# Patient Record
Sex: Male | Born: 1985 | Race: White | Hispanic: No | Marital: Single | Smoking: Current every day smoker
Health system: Southern US, Community
[De-identification: ages and names within clinical notes are randomized; demographics above are authoritative.]

## PROBLEM LIST (undated history)

## (undated) DIAGNOSIS — N200 Calculus of kidney: Secondary | ICD-10-CM

## (undated) DIAGNOSIS — I10 Essential (primary) hypertension: Secondary | ICD-10-CM

## (undated) HISTORY — PX: FOOT TUMOR EXCISION: SUR566

---
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
## 2004-01-05 ENCOUNTER — Encounter: Admission: RE | Admit: 2004-01-05 | Discharge: 2004-01-05 | Payer: Self-pay | Admitting: Family Medicine

## 2007-08-30 ENCOUNTER — Emergency Department (HOSPITAL_COMMUNITY): Admission: EM | Admit: 2007-08-30 | Discharge: 2007-08-30 | Payer: Self-pay | Admitting: Family Medicine

## 2009-12-14 ENCOUNTER — Emergency Department (HOSPITAL_COMMUNITY): Admission: EM | Admit: 2009-12-14 | Discharge: 2009-12-14 | Payer: Self-pay | Admitting: Emergency Medicine

## 2010-10-23 LAB — BASIC METABOLIC PANEL
BUN: 9 mg/dL (ref 6–23)
CO2: 28 mEq/L (ref 19–32)
Calcium: 8.7 mg/dL (ref 8.4–10.5)
Chloride: 105 mEq/L (ref 96–112)
Creatinine, Ser: 0.81 mg/dL (ref 0.4–1.5)
GFR calc Af Amer: >60 mL/min (ref >60)
GFR calc non Af Amer: >60 mL/min (ref >60)
Glucose, Bld: 115 mg/dL — ABNORMAL HIGH (ref 70–99)
Potassium: 4.2 mEq/L (ref 3.5–5.1)
Sodium: 140 mEq/L (ref 135–145)

## 2010-10-23 LAB — URINALYSIS, ROUTINE W REFLEX MICROSCOPIC
Glucose, UA: NEGATIVE mg/dL
Ketones, ur: NEGATIVE mg/dL
Leukocytes, UA: NEGATIVE
Nitrite: NEGATIVE
Specific Gravity, Urine: 1.006 (ref 1.005–1.030)
pH: 6.5 (ref 5.0–8.0)

## 2010-10-23 LAB — HEPATIC FUNCTION PANEL
ALT: 32 U/L (ref 0–53)
Alkaline Phosphatase: 82 U/L (ref 39–117)
Total Bilirubin: 0.5 mg/dL (ref 0.3–1.2)

## 2010-10-23 LAB — CBC
HCT: 47.9 % (ref 39.0–52.0)
Hemoglobin: 15.9 g/dL (ref 13.0–17.0)
RDW: 13 % (ref 11.5–15.5)
WBC: 16.8 10*3/uL — ABNORMAL HIGH (ref 4.0–10.5)

## 2010-10-23 LAB — URINE MICROSCOPIC-ADD ON

## 2010-10-23 LAB — LIPASE, BLOOD: Lipase: 21 U/L (ref 11–59)

## 2010-10-23 LAB — DIFFERENTIAL
Eosinophils Relative: 1 % (ref 0–5)
Lymphocytes Relative: 7 % — ABNORMAL LOW (ref 12–46)
Lymphs Abs: 1.1 10*3/uL (ref 0.7–4.0)
Monocytes Absolute: 0.5 10*3/uL (ref 0.1–1.0)

## 2011-09-15 ENCOUNTER — Encounter (HOSPITAL_BASED_OUTPATIENT_CLINIC_OR_DEPARTMENT_OTHER): Payer: Self-pay | Admitting: *Deleted

## 2011-09-15 ENCOUNTER — Emergency Department (INDEPENDENT_AMBULATORY_CARE_PROVIDER_SITE_OTHER): Payer: Managed Care, Other (non HMO)

## 2011-09-15 ENCOUNTER — Emergency Department (HOSPITAL_BASED_OUTPATIENT_CLINIC_OR_DEPARTMENT_OTHER)
Admission: EM | Admit: 2011-09-15 | Discharge: 2011-09-15 | Disposition: A | Discharge status: Home / Self Care | Payer: Managed Care, Other (non HMO) | Attending: Emergency Medicine | Admitting: Emergency Medicine

## 2011-09-15 DIAGNOSIS — M79609 Pain in unspecified limb: Secondary | ICD-10-CM

## 2011-09-15 DIAGNOSIS — M65979 Unspecified synovitis and tenosynovitis, unspecified ankle and foot: Secondary | ICD-10-CM | POA: Insufficient documentation

## 2011-09-15 DIAGNOSIS — M25569 Pain in unspecified knee: Secondary | ICD-10-CM

## 2011-09-15 DIAGNOSIS — F17200 Nicotine dependence, unspecified, uncomplicated: Secondary | ICD-10-CM | POA: Insufficient documentation

## 2011-09-15 DIAGNOSIS — M25579 Pain in unspecified ankle and joints of unspecified foot: Secondary | ICD-10-CM | POA: Insufficient documentation

## 2011-09-15 DIAGNOSIS — M25673 Stiffness of unspecified ankle, not elsewhere classified: Secondary | ICD-10-CM

## 2011-09-15 DIAGNOSIS — M25669 Stiffness of unspecified knee, not elsewhere classified: Secondary | ICD-10-CM

## 2011-09-15 DIAGNOSIS — M659 Synovitis and tenosynovitis, unspecified: Secondary | ICD-10-CM | POA: Insufficient documentation

## 2011-09-15 DIAGNOSIS — M25676 Stiffness of unspecified foot, not elsewhere classified: Secondary | ICD-10-CM

## 2011-09-15 DIAGNOSIS — M7750 Other enthesopathy of unspecified foot: Secondary | ICD-10-CM

## 2011-09-15 MED ORDER — IBUPROFEN 800 MG PO TABS: 800.0000 mg | ORAL_TABLET | Freq: Three times a day (TID) | ORAL | Status: DC

## 2011-09-15 MED ORDER — IBUPROFEN 800 MG PO TABS: 800.0000 mg | ORAL_TABLET | Freq: Three times a day (TID) | ORAL | Status: AC

## 2011-09-15 MED ORDER — HYDROCODONE-ACETAMINOPHEN 5-325 MG PO TABS: 2.0000 | ORAL_TABLET | ORAL | Status: AC | PRN

## 2011-09-15 NOTE — ED Provider Notes (Signed)
History     CSN: [id number]  Arrival date & time 09/15/11  1940   First MD Initiated Contact with Patient 09/15/11 2125      Chief Complaint  Patient presents with  . Knee Pain  . Foot Pain    (Consider location/radiation/quality/duration/timing/severity/associated sxs/prior treatment) Patient is a 26 y.o. male presenting with foot injury. The history is provided by the patient. No language interpreter was used.  Foot Injury  The incident occurred more than 1 week ago. The incident occurred at work. There was no injury mechanism. The pain is present in the right foot, left foot and left knee. The quality of the pain is described as aching and burning. The pain is at a severity of 7/10. The pain is severe. The pain has been constant since onset. Associated symptoms include inability to bear weight and tingling. He reports no foreign bodies present. He has tried nothing for the symptoms. The treatment provided no relief.  Pt reports he had pain in the top of both feet.  No know injury.  Pt reports he can not walk pain is so severe.  Pt reports he also has pain in his left knee.  Pt reports co worker had to carry him on Friday the pain was so bad.  History reviewed. No pertinent past medical history.  Past Surgical History  Procedure Date  . Foot tumor excision     History reviewed. No pertinent family history.  History  Substance Use Topics  . Smoking status: Current Everyday Smoker  . Smokeless tobacco: Not on file  . Alcohol Use: No      Review of Systems  Musculoskeletal: Positive for myalgias and gait problem. Negative for back pain and joint swelling.  Neurological: Positive for tingling.  All other systems reviewed and are negative.    Allergies  Review of patient's allergies indicates no known allergies.  Home Medications  No current outpatient prescriptions on file.  BP 126/78  Pulse 75  Temp(Src) 98.2 F (36.8 C) (Oral)  Resp 16  SpO2 100%  Physical  Exam  Nursing note and vitals reviewed. Constitutional: He is oriented to person, place, and time. He appears well-developed and well-nourished.  HENT:  Head: Normocephalic.  Musculoskeletal: He exhibits edema and tenderness.  Neurological: He is alert and oriented to person, place, and time. He has normal reflexes.  Skin: Skin is warm.  Psychiatric: He has a normal mood and affect.    ED Course  Procedures (including critical care time)  Labs Reviewed - No data to display Dg Knee 1-2 Views Left  09/15/2011  *RADIOLOGY REPORT*  Clinical Data: Pain and stiffness in the knee for several weeks. No known injury.  LEFT KNEE - 1-2 VIEW  Comparison: None.  Findings: The left knee appears intact.  No evidence of acute fracture or subluxation.  No focal bone lesion or bone destruction. Focal broad-based depression in the articular surface of the patella may represent changes of chondromalacia.  Bone cortex and trabecular architecture are otherwise intact.  No abnormal periosteal reaction.  No radiopaque foreign bodies.  No significant effusion.  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  No evidence of acute fracture or subluxation.  Original Report Authenticated By: Marlon Pel, M.D.   Dg Foot 2 Views Left  09/15/2011  *RADIOLOGY REPORT*  Clinical Data: Pain and stiffness in the foot for several weeks. No known injury.  LEFT FOOT - 2 VIEW  Comparison: None.  Findings: The left foot appears intact.  No evidence of acute fracture or subluxation.  No focal bone lesion or bone destruction. Bone cortex and trabecular architecture appear intact.  No abnormal periosteal reaction.  No radiopaque foreign bodies.  IMPRESSION: No acute bony abnormalities identified.  Original Report Authenticated By: Marlon Pel, M.D.   Dg Foot 2 Views Right  09/15/2011  *RADIOLOGY REPORT*  Clinical Data: Pain and stiffness in the foot for several  weeks. No known injury.  RIGHT FOOT - 2 VIEW  Comparison: None.  Findings: The right foot appears intact.  No evidence of acute fracture or subluxation.  No focal bone lesion or bone destruction. Bone cortex and trabecular architecture appear intact.  No abnormal periosteal reaction.  No radiopaque foreign bodies.  IMPRESSION: No acute bony abnormalities identified.  Original Report Authenticated By: Marlon Pel, M.D.     No diagnosis found.    MDM   I advised pt to follow up with Dr. Pearletha Forge for recheck.  Pt given rx for pain and ibuprofen for inflammation.         Langston Masker, [place] 09/15/11 2305

## 2011-09-15 NOTE — ED Provider Notes (Signed)
Medical screening examination/treatment/procedure(s) were performed by non-physician practitioner and as supervising physician I was immediately available for consultation/collaboration.   Forbes Cellar, MD 09/15/11 [phone]

## 2011-09-15 NOTE — ED Notes (Signed)
Pt presents with bilat foot pain and left knee pain x a couple of weeks

## 2011-10-03 ENCOUNTER — Ambulatory Visit (INDEPENDENT_AMBULATORY_CARE_PROVIDER_SITE_OTHER): Payer: Managed Care, Other (non HMO) | Admitting: Family Medicine

## 2011-10-03 ENCOUNTER — Ambulatory Visit: Payer: Managed Care, Other (non HMO)

## 2011-10-03 VITALS — BP 135/83 | HR 60 | Temp 98.7°F | Resp 12 | Ht 73.0 in | Wt 230.0 lb

## 2011-10-03 DIAGNOSIS — S0180XA Unspecified open wound of other part of head, initial encounter: Secondary | ICD-10-CM

## 2011-10-03 DIAGNOSIS — T148XXA Other injury of unspecified body region, initial encounter: Secondary | ICD-10-CM

## 2011-10-03 DIAGNOSIS — S0181XA Laceration without foreign body of other part of head, initial encounter: Secondary | ICD-10-CM

## 2011-10-03 DIAGNOSIS — M549 Dorsalgia, unspecified: Secondary | ICD-10-CM

## 2011-10-03 DIAGNOSIS — G501 Atypical facial pain: Secondary | ICD-10-CM

## 2011-10-03 MED ORDER — CYCLOBENZAPRINE HCL 5 MG PO TABS: 5.0000 mg | ORAL_TABLET | Freq: Three times a day (TID) | ORAL | Status: AC | PRN

## 2011-10-03 MED ORDER — SULFAMETHOXAZOLE-TRIMETHOPRIM 800-160 MG PO TABS: 1.0000 | ORAL_TABLET | Freq: Two times a day (BID) | ORAL | Status: AC

## 2011-10-03 MED ORDER — TRAMADOL HCL 50 MG PO TABS: 50.0000 mg | ORAL_TABLET | Freq: Three times a day (TID) | ORAL | Status: AC | PRN

## 2011-10-03 NOTE — Progress Notes (Signed)
  [facility]:  Office Visit  Chief Complaint:  Chief Complaint  Patient presents with  . Facial Laceration    HPI: Ross Best is a 26 y.o. male who complains of  Nasal laceration after getting hit by a crow bar  In the face specifically across the bridge of his nose and left eye socket while trying to dig up a tree root/clearing out underbrush.Tetanus UTD, 2 years ago. MIld pain, no fevers, chills. Pain is behind eyes. Denies bleeding. He also pulled his back.   No past medical history on file. Past Surgical History  Procedure Date  . Foot tumor excision    History   Social History  . Marital Status: Single    Spouse Name: N/A    Number of Children: N/A  . Years of Education: N/A   Social History Main Topics  . Smoking status: Current Everyday Smoker -- 1.0 packs/day    Types: Cigarettes  . Smokeless tobacco: None  . Alcohol Use: No  . Drug Use: No  . Sexually Active: None   Other Topics Concern  . None   Social History Narrative  . None   No family history on file. No Known Allergies Prior to Admission medications   Not on File     ROS: The patient denies fevers, chills, night sweats, unintentional weight loss, chest pain, palpitations, wheezing, dyspnea on exertion, nausea, vomiting, abdominal pain, dysuria, hematuria, melena, numbness, weakness, or tingling. + facial pain and back pain  All other systems have been reviewed and were otherwise negative with the exception of those mentioned in the HPI and as above.    PHYSICAL EXAM: Filed Vitals:   10/03/11 1846  BP: 135/83  Pulse: 60  Temp: 98.7 F (37.1 C)  Resp: 12   Filed Vitals:   10/03/11 1846  Height: 6\' 1"  (1.854 m)  Weight: 230 lb (104.327 kg)    Body mass index is 30.34 kg/(m^2).  General: Alert, no acute distress HEENT:  Normocephalic, atraumatic, oropharynx patent. + laceration, pustular across nasal bridge, no appreciable ecchymosis or battle signs. Mild  tenderness along nasal bridge and left cheekbone Cardiovascular:  Regular rate and rhythm, no rubs murmurs or gallops.  No Carotid bruits, radial pulse intact. No pedal edema.  Respiratory: Clear to auscultation bilaterally.  No wheezes, rales, or rhonchi.  No cyanosis, no use of accessory musculature GI: No organomegaly, abdomen is soft and non-tender, positive bowel sounds.  No masses. Skin: No rashes. + wound Neurologic: Facial musculature symmetric. CN2-CN12 grossly intact.  Psychiatric: Patient is appropriate throughout our interaction. Lymphatic: No cervical lymphadenopathy Musculoskeletal: Gait intact. Back normal AROM, PROM, tender along bilateral paraspinal msk, 5/5 motor and sensation   LABS:  EKG/XRAY:   Primary read interpreted by Dr. Conley Rolls at [facility]. Xrays of orbits and nasal bones are negative for fx or dislocation.   ASSESSMENT/PLAN: Encounter Diagnoses  Name Primary?  . Face lacerations Yes  . Back pain    Rx Bactrim for wound infection, wound cx obtained, wound care instructions given, wound cx results pending Rx Tramadol, Flexeril for back pain, most likely muscular in origin, if no improvement in 2 weeks then return for xray   Hamilton Capri PHUONG, DO 10/03/2011 8:31 PM

## 2011-10-06 LAB — WOUND CULTURE
Gram Stain: NONE SEEN
Gram Stain: NONE SEEN
Organism ID, Bacteria: NORMAL

## 2012-04-07 ENCOUNTER — Emergency Department (HOSPITAL_COMMUNITY): Payer: Self-pay

## 2012-04-07 ENCOUNTER — Emergency Department (HOSPITAL_COMMUNITY)
Admission: EM | Admit: 2012-04-07 | Discharge: 2012-04-07 | Discharge status: Against Medical Advice | Payer: Self-pay | Attending: Emergency Medicine | Admitting: Emergency Medicine

## 2012-04-07 DIAGNOSIS — R002 Palpitations: Secondary | ICD-10-CM | POA: Insufficient documentation

## 2012-04-07 LAB — POCT I-STAT, CHEM 8
BUN: 10 mg/dL (ref 6–23)
Calcium, Ion: 1.2 mmol/L (ref 1.12–1.23)
Chloride: 104 mEq/L (ref 96–112)
Glucose, Bld: 84 mg/dL (ref 70–99)
TCO2: 26 mmol/L (ref 0–100)

## 2012-04-07 LAB — CBC WITH DIFFERENTIAL/PLATELET
Basophils Relative: 0 % (ref 0–1)
Eosinophils Relative: 2 % (ref 0–5)
HCT: 45.8 % (ref 39.0–52.0)
Hemoglobin: 15.6 g/dL (ref 13.0–17.0)
Lymphocytes Relative: 21 % (ref 12–46)
MCHC: 34.1 g/dL (ref 30.0–36.0)
MCV: 84.5 fL (ref 78.0–100.0)
Monocytes Absolute: 0.9 10*3/uL (ref 0.1–1.0)
Monocytes Relative: 8 % (ref 3–12)
Neutro Abs: 7.2 10*3/uL (ref 1.7–7.7)

## 2012-04-07 NOTE — ED Notes (Signed)
Pt reports palpitations x 2 days. ekg normal. Denies chest pain. 324asa pta. 18g(L)AC.

## 2013-03-03 IMAGING — CR DG FOOT 2V*R*
2 series · 2 of 2 positions shown · non-contrast
Comparison: None.

CLINICAL DATA: Pain and stiffness in the foot for several weeks.
No known injury.

RIGHT FOOT - 2 VIEW

[t foot ap right]
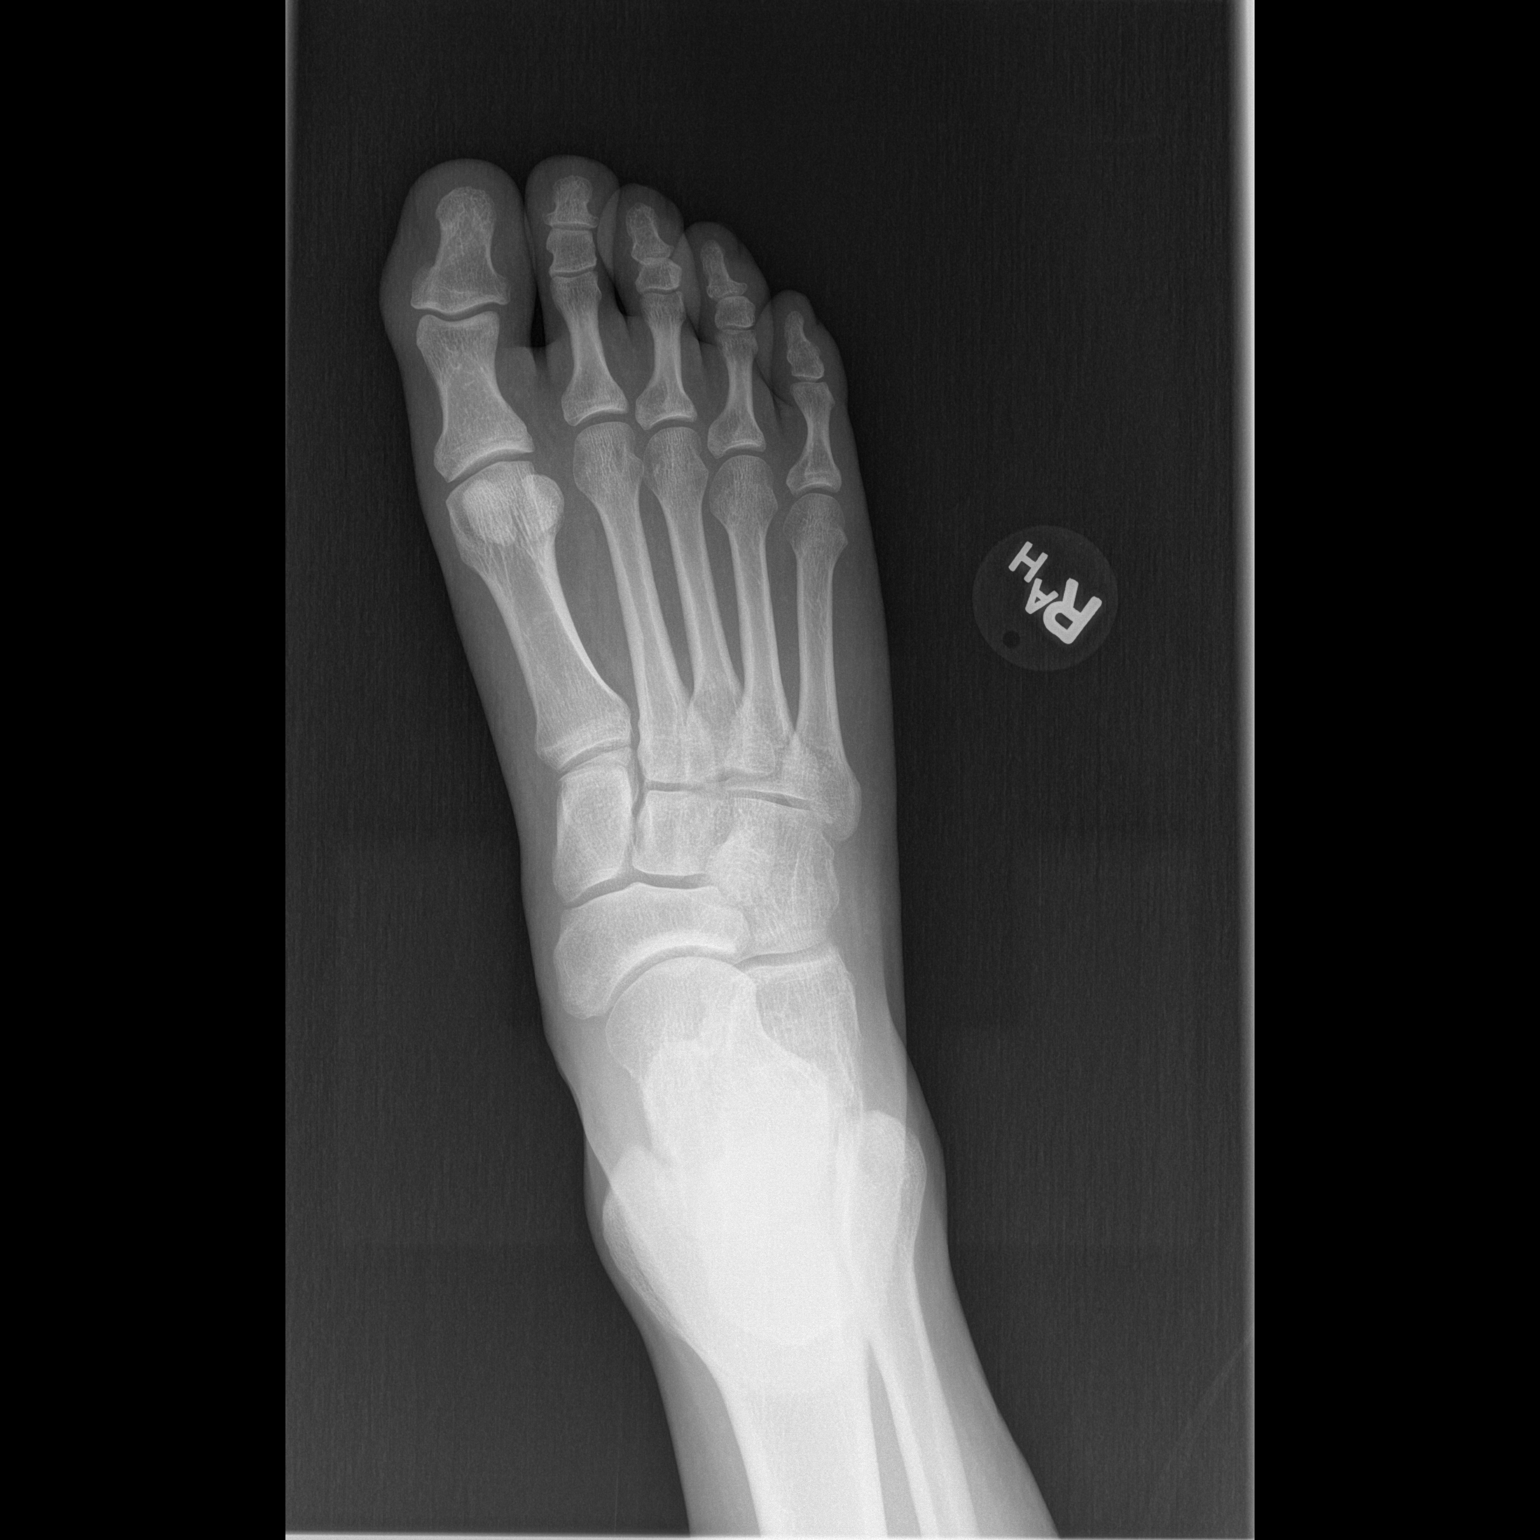

[t foot lat right]
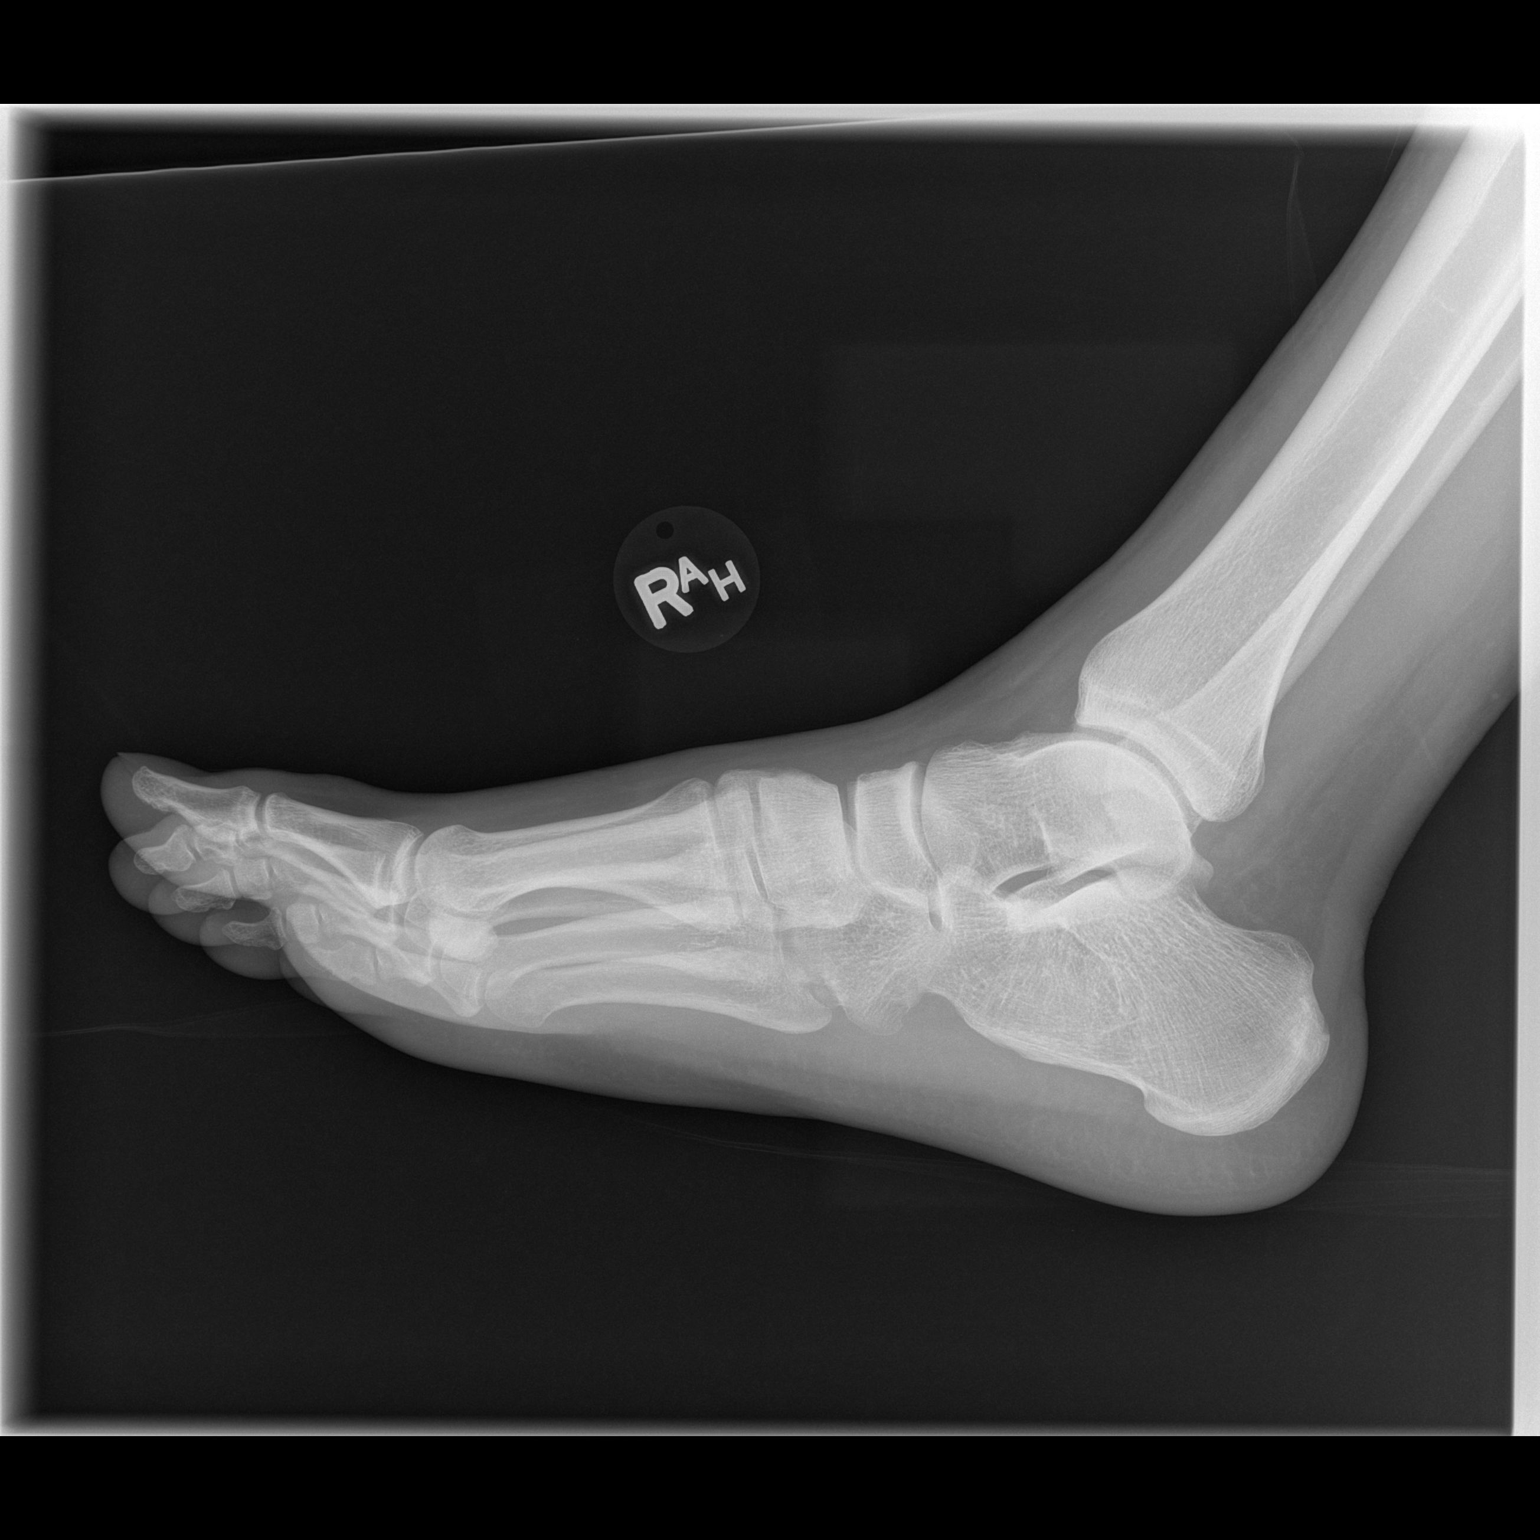

[2 of 2 positions shown; findings below may reference images not displayed]

FINDINGS: The right foot appears intact.  No evidence of acute
fracture or subluxation.  No focal bone lesion or bone destruction.
Bone cortex and trabecular architecture appear intact.  No abnormal
periosteal reaction.  No radiopaque foreign bodies.
IMPRESSION: No acute bony abnormalities identified.

## 2013-03-03 IMAGING — CR DG KNEE 1-2V*L*
2 series · 2 of 2 positions shown · non-contrast
Comparison: None.

CLINICAL DATA: Pain and stiffness in the knee for several weeks.
No known injury.

LEFT KNEE - 1-2 VIEW

[t knee ap left]
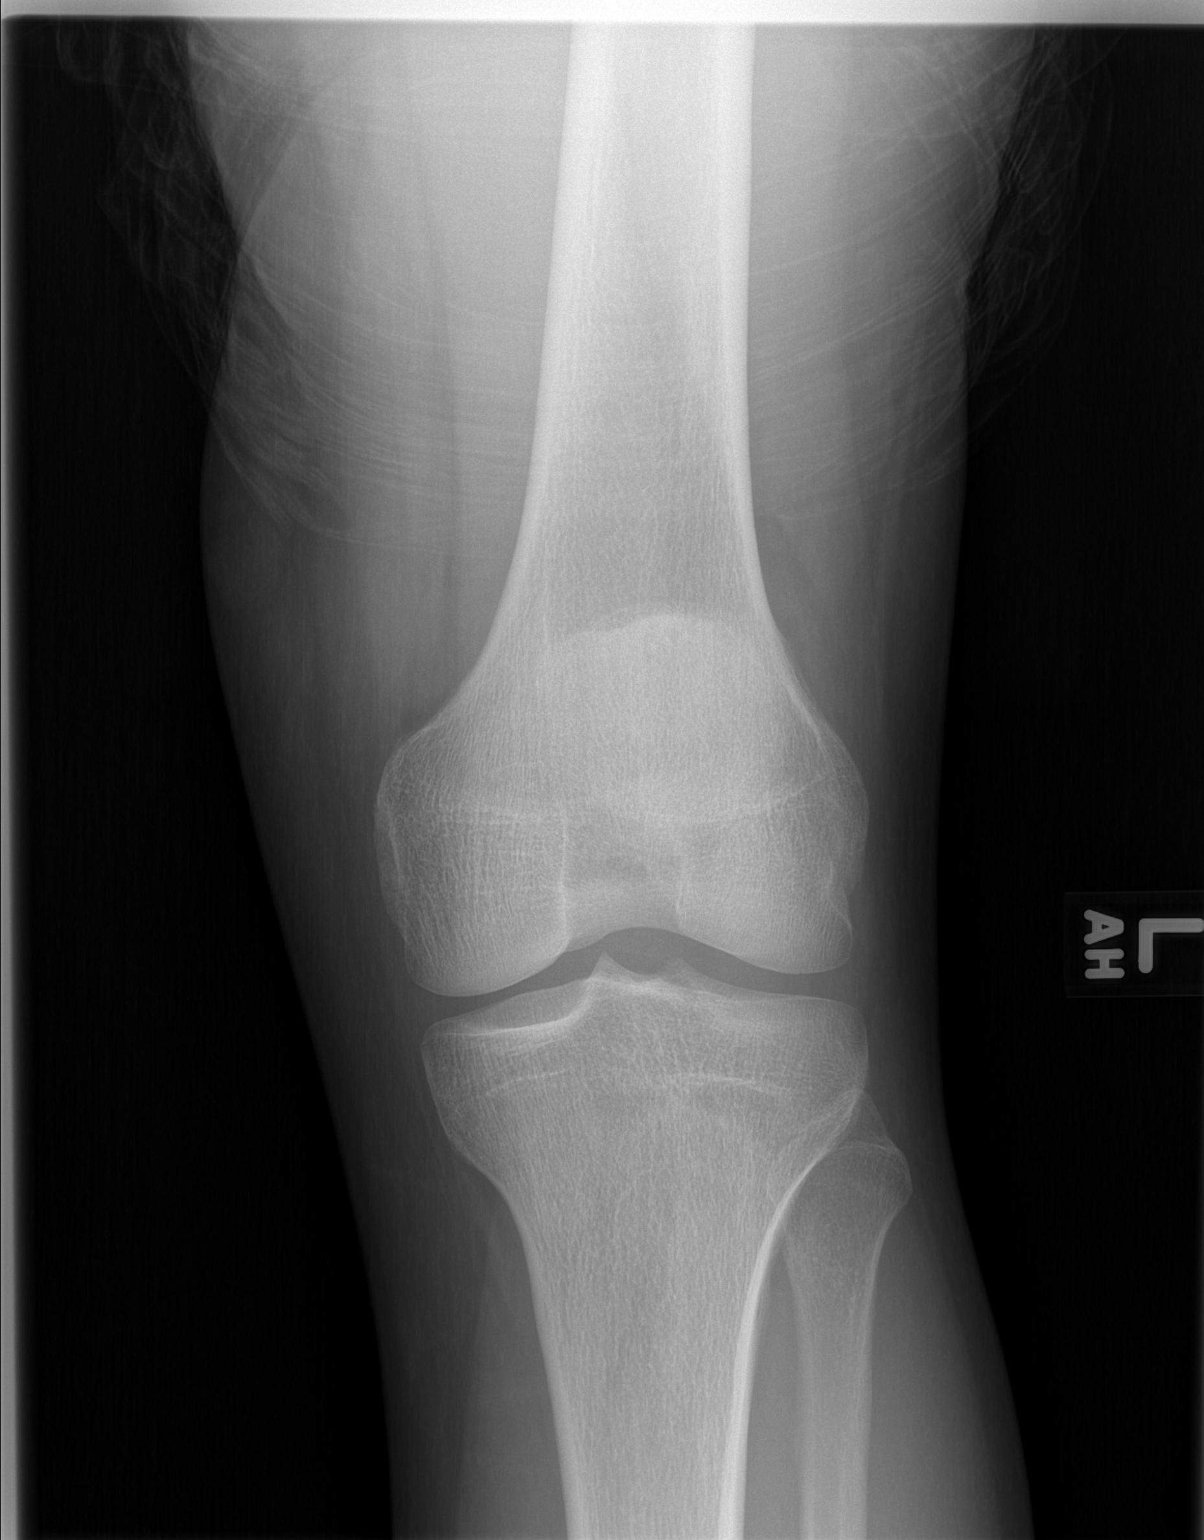

[t knee lat left]
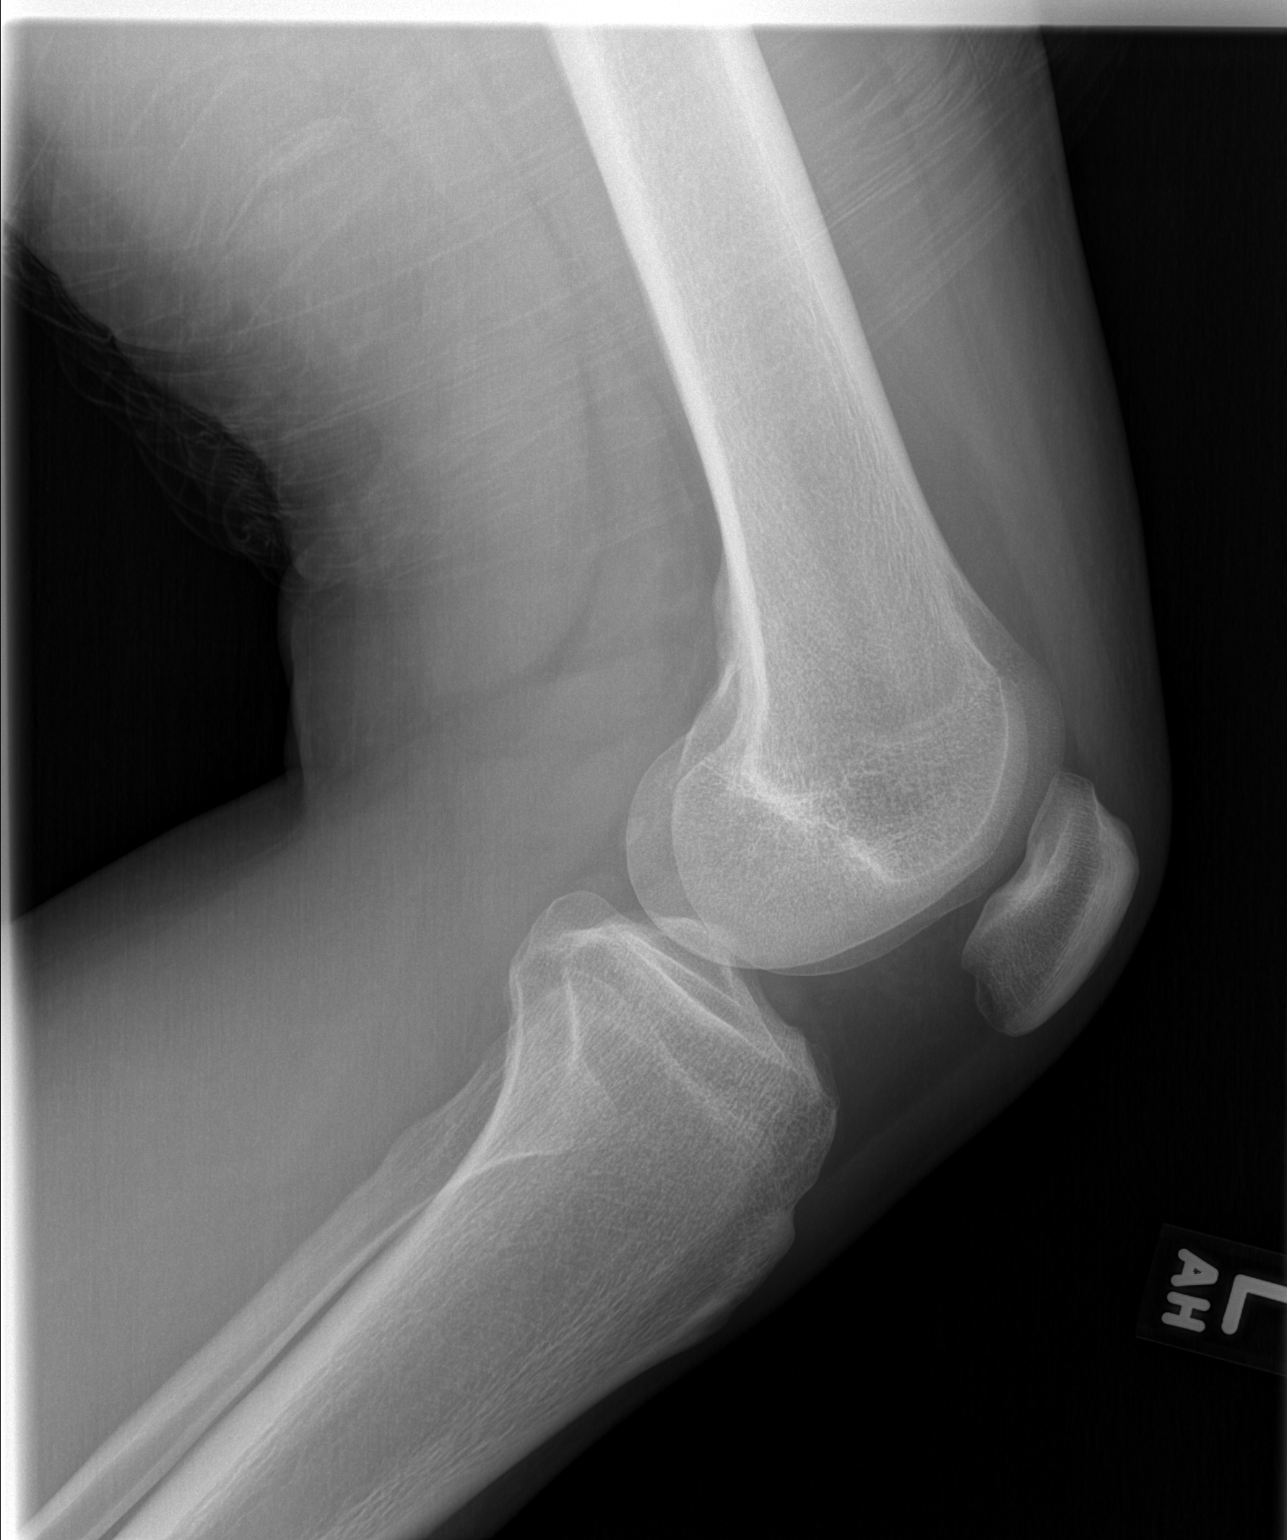

[2 of 2 positions shown; findings below may reference images not displayed]

FINDINGS: The left knee appears intact.  No evidence of acute
fracture or subluxation.  No focal bone lesion or bone destruction.
Focal broad-based depression in the articular surface of the
patella may represent changes of chondromalacia.  Bone cortex and
trabecular architecture are otherwise intact.  No abnormal
periosteal reaction.  No radiopaque foreign bodies.  No significant
effusion.
IMPRESSION: Focal central depression in the articular surface of the patella
may represent chondromalacia.  No evidence of acute fracture or
subluxation.

## 2013-03-21 IMAGING — CR DG NASAL BONES 3+V
2 series · 2 of 2 positions shown · non-contrast
Comparison: None.

CLINICAL DATA: Blunt trauma to the nose.

NASAL BONES - 3+ VIEW

[ap waters]
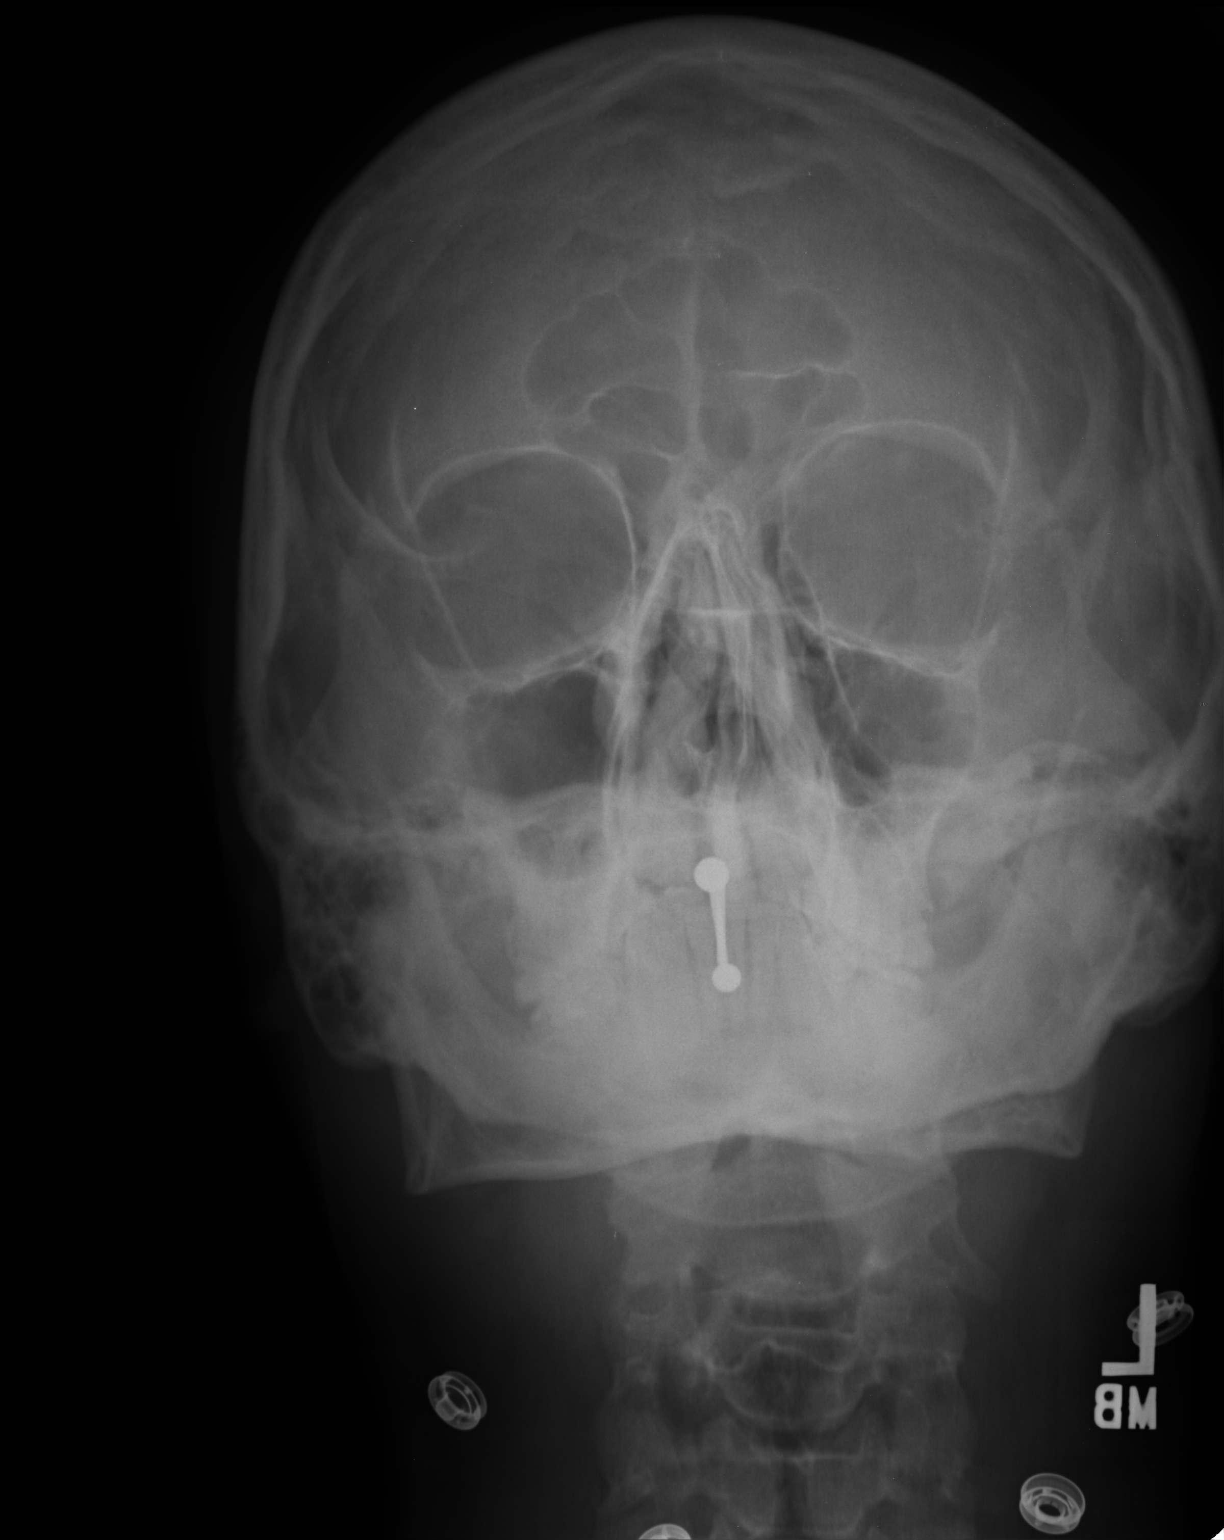

[lateral]
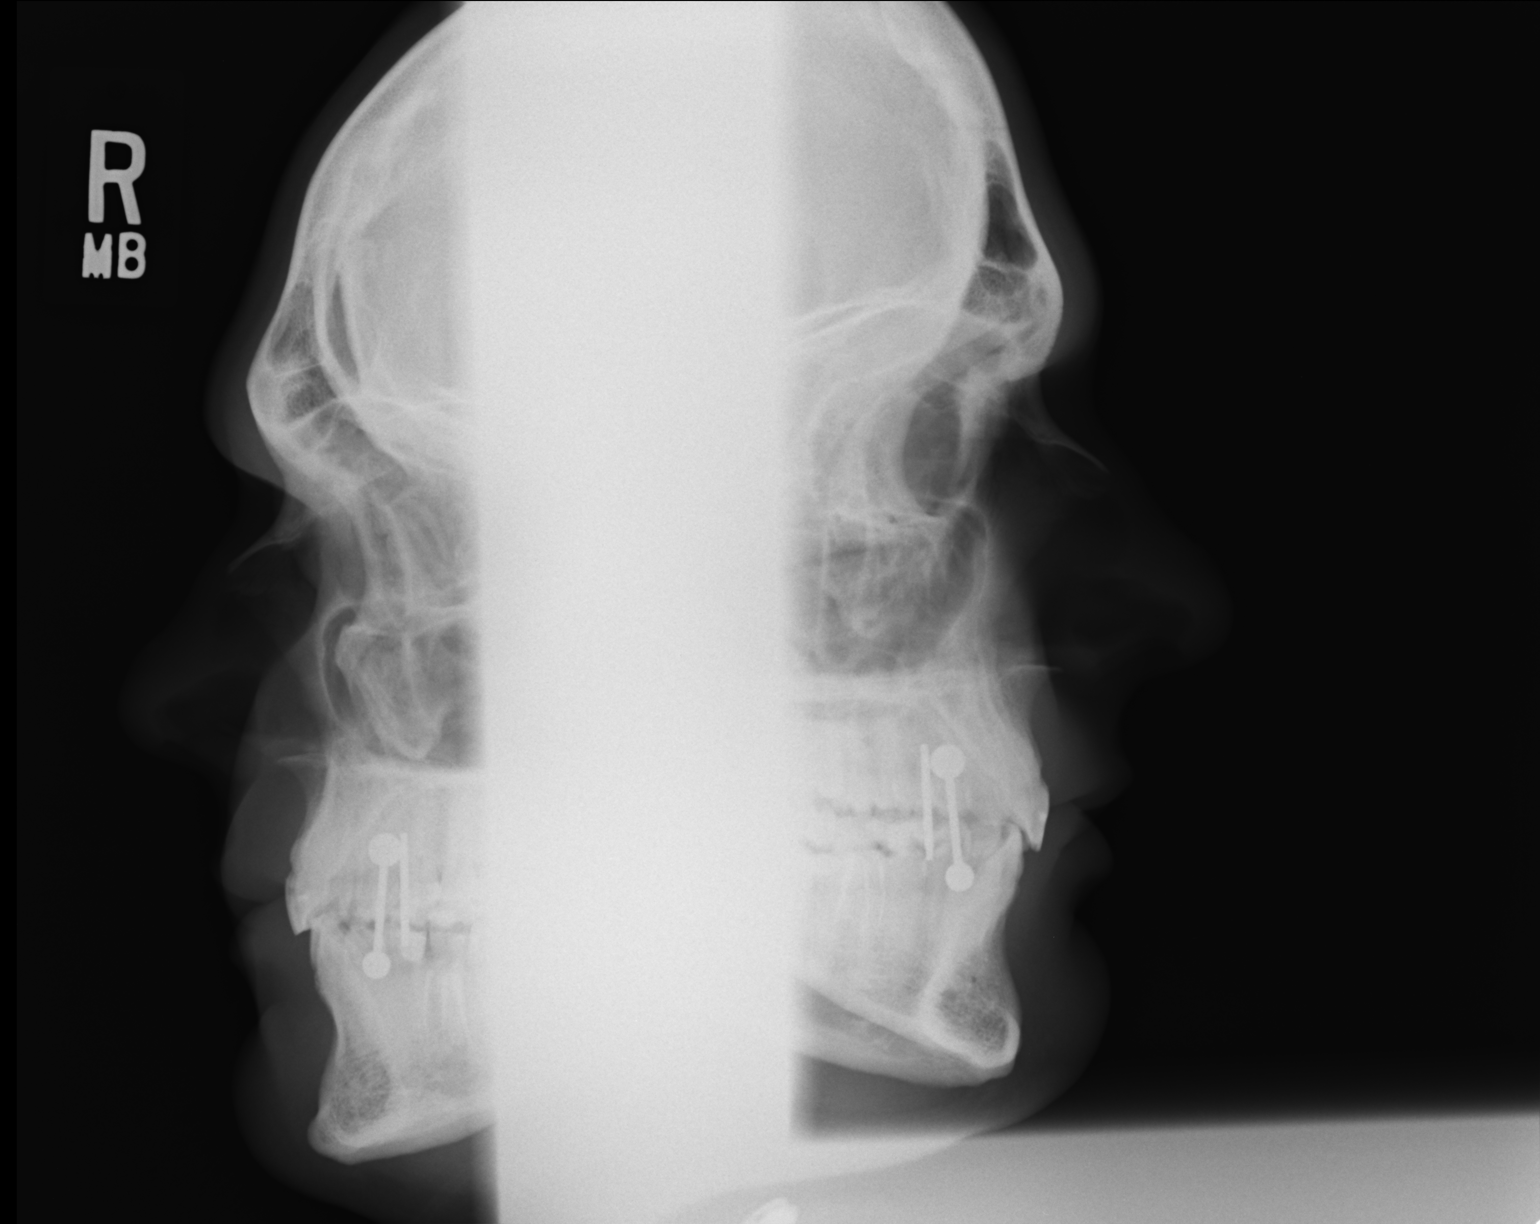

[2 of 2 positions shown; findings below may reference images not displayed]

FINDINGS: There is no acute fracture of the nasal bones.  The
patient does have nasal septal deviation from right to left with a
slight deformity of the left side of the nasal bone but this is
probably either developmental or due to remote trauma.
IMPRESSION: No acute abnormalities of the nasal bones.  The other visualized
facial bones are normal.

## 2013-04-05 ENCOUNTER — Encounter (HOSPITAL_BASED_OUTPATIENT_CLINIC_OR_DEPARTMENT_OTHER): Payer: Self-pay

## 2013-04-05 ENCOUNTER — Emergency Department (HOSPITAL_BASED_OUTPATIENT_CLINIC_OR_DEPARTMENT_OTHER)
Admission: EM | Admit: 2013-04-05 | Discharge: 2013-04-05 | Disposition: A | Discharge status: Home / Self Care | Payer: Managed Care, Other (non HMO) | Attending: Emergency Medicine | Admitting: Emergency Medicine

## 2013-04-05 ENCOUNTER — Emergency Department (HOSPITAL_BASED_OUTPATIENT_CLINIC_OR_DEPARTMENT_OTHER): Payer: Self-pay

## 2013-04-05 DIAGNOSIS — R059 Cough, unspecified: Secondary | ICD-10-CM | POA: Insufficient documentation

## 2013-04-05 DIAGNOSIS — R05 Cough: Secondary | ICD-10-CM

## 2013-04-05 DIAGNOSIS — R509 Fever, unspecified: Secondary | ICD-10-CM | POA: Insufficient documentation

## 2013-04-05 DIAGNOSIS — F17200 Nicotine dependence, unspecified, uncomplicated: Secondary | ICD-10-CM | POA: Insufficient documentation

## 2013-04-05 MED ORDER — HYDROCOD POLST-CHLORPHEN POLST 10-8 MG/5ML PO LQCR: 5.0000 mL | Freq: Two times a day (BID) | ORAL | Status: DC | PRN

## 2013-04-05 NOTE — ED Provider Notes (Signed)
Medical screening examination/treatment/procedure(s) were performed by non-physician practitioner and as supervising physician I was immediately available for consultation/collaboration.   Shanna Cisco, MD 04/05/13 [phone]

## 2013-04-05 NOTE — ED Notes (Signed)
Pt reports a cough and fever unrelieved by OTC medications x 3 weeks.

## 2013-04-05 NOTE — ED Provider Notes (Signed)
CSN: [id number]     Arrival date & time 04/05/13  1115 History   First MD Initiated Contact with Patient 04/05/13 1153     Chief Complaint  Patient presents with  . Cough  . Fever   (Consider location/radiation/quality/duration/timing/severity/associated sxs/prior Treatment) HPI Comments: Pt state that he has had a cough for 3 weeks:with subjective fever:unrelieved with otc medication  Patient is a 27 y.o. male presenting with cough. The history is provided by the patient. No language interpreter was used.  Cough Cough characteristics:  Non-productive Severity:  Moderate Onset quality:  Gradual Duration:  3 weeks Timing:  Constant Progression:  Unchanged Relieved by:  Nothing Exacerbated by: otc medication. Associated symptoms: fever     History reviewed. No pertinent past medical history. History reviewed. No pertinent past surgical history. No family history on file. History  Substance Use Topics  . Smoking status: Current Every Day Smoker -- 1.00 packs/day    Types: Cigarettes  . Smokeless tobacco: Not on file  . Alcohol Use: Yes     Comment: weekly    Review of Systems  Constitutional: Positive for fever.  Respiratory: Positive for cough.   Cardiovascular: Negative.     Allergies  Review of patient's allergies indicates no known allergies.  Home Medications   Current Outpatient Rx  Name  Route  Sig  Dispense  Refill  . chlorpheniramine-HYDROcodone (TUSSIONEX PENNKINETIC ER) 10-8 MG/5ML LQCR   Oral   Take 5 mLs by mouth every 12 (twelve) hours as needed.   140 mL   0    BP 139/82  Pulse 82  Temp(Src) 98.7 F (37.1 C) (Oral)  Resp 18  Ht 6\' 1"  (1.854 m)  Wt 240 lb (108.863 kg)  BMI 31.67 kg/m2  SpO2 99% Physical Exam  Nursing note and vitals reviewed. Constitutional: He appears well-developed and well-nourished.  HENT:  Head: Normocephalic and atraumatic.  Right Ear: External ear normal.  Left Ear: External ear normal.  Mouth/Throat: Oropharynx  is clear and moist.  Eyes: Conjunctivae and EOM are normal.  Neck: Normal range of motion. Neck supple.  Cardiovascular: Normal rate and regular rhythm.   Pulmonary/Chest: Effort normal and breath sounds normal.  Musculoskeletal: Normal range of motion.    ED Course  Procedures (including critical care time) Labs Review Labs Reviewed - No data to display Imaging Review Dg Chest 2 View  04/05/2013   *RADIOLOGY REPORT*  Clinical Data: Cough.  Persistent cough.  Fever.  Cigarette smoker.  CHEST - 2 VIEW  Comparison: None.  Findings:  Cardiopericardial silhouette within normal limits. Mediastinal contours normal. Trachea midline.  No airspace disease or effusion.  IMPRESSION: No active cardiopulmonary disease.   Original Report Authenticated By: Andreas Newport, M.D.    MDM   1. Cough    No infection noted on x-ray:will treat symptomatically    Teressa Lower, NP 04/05/13 1233

## 2013-04-05 NOTE — ED Notes (Signed)
Pt sts shob worsening with exertion.

## 2013-09-24 IMAGING — CR DG CHEST 2V
2 series · 2 of 2 positions shown · non-contrast
Comparison: Chest radiograph 01/05/2004

CLINICAL DATA: Chest pain

CHEST - 2 VIEW

[w chest pa]
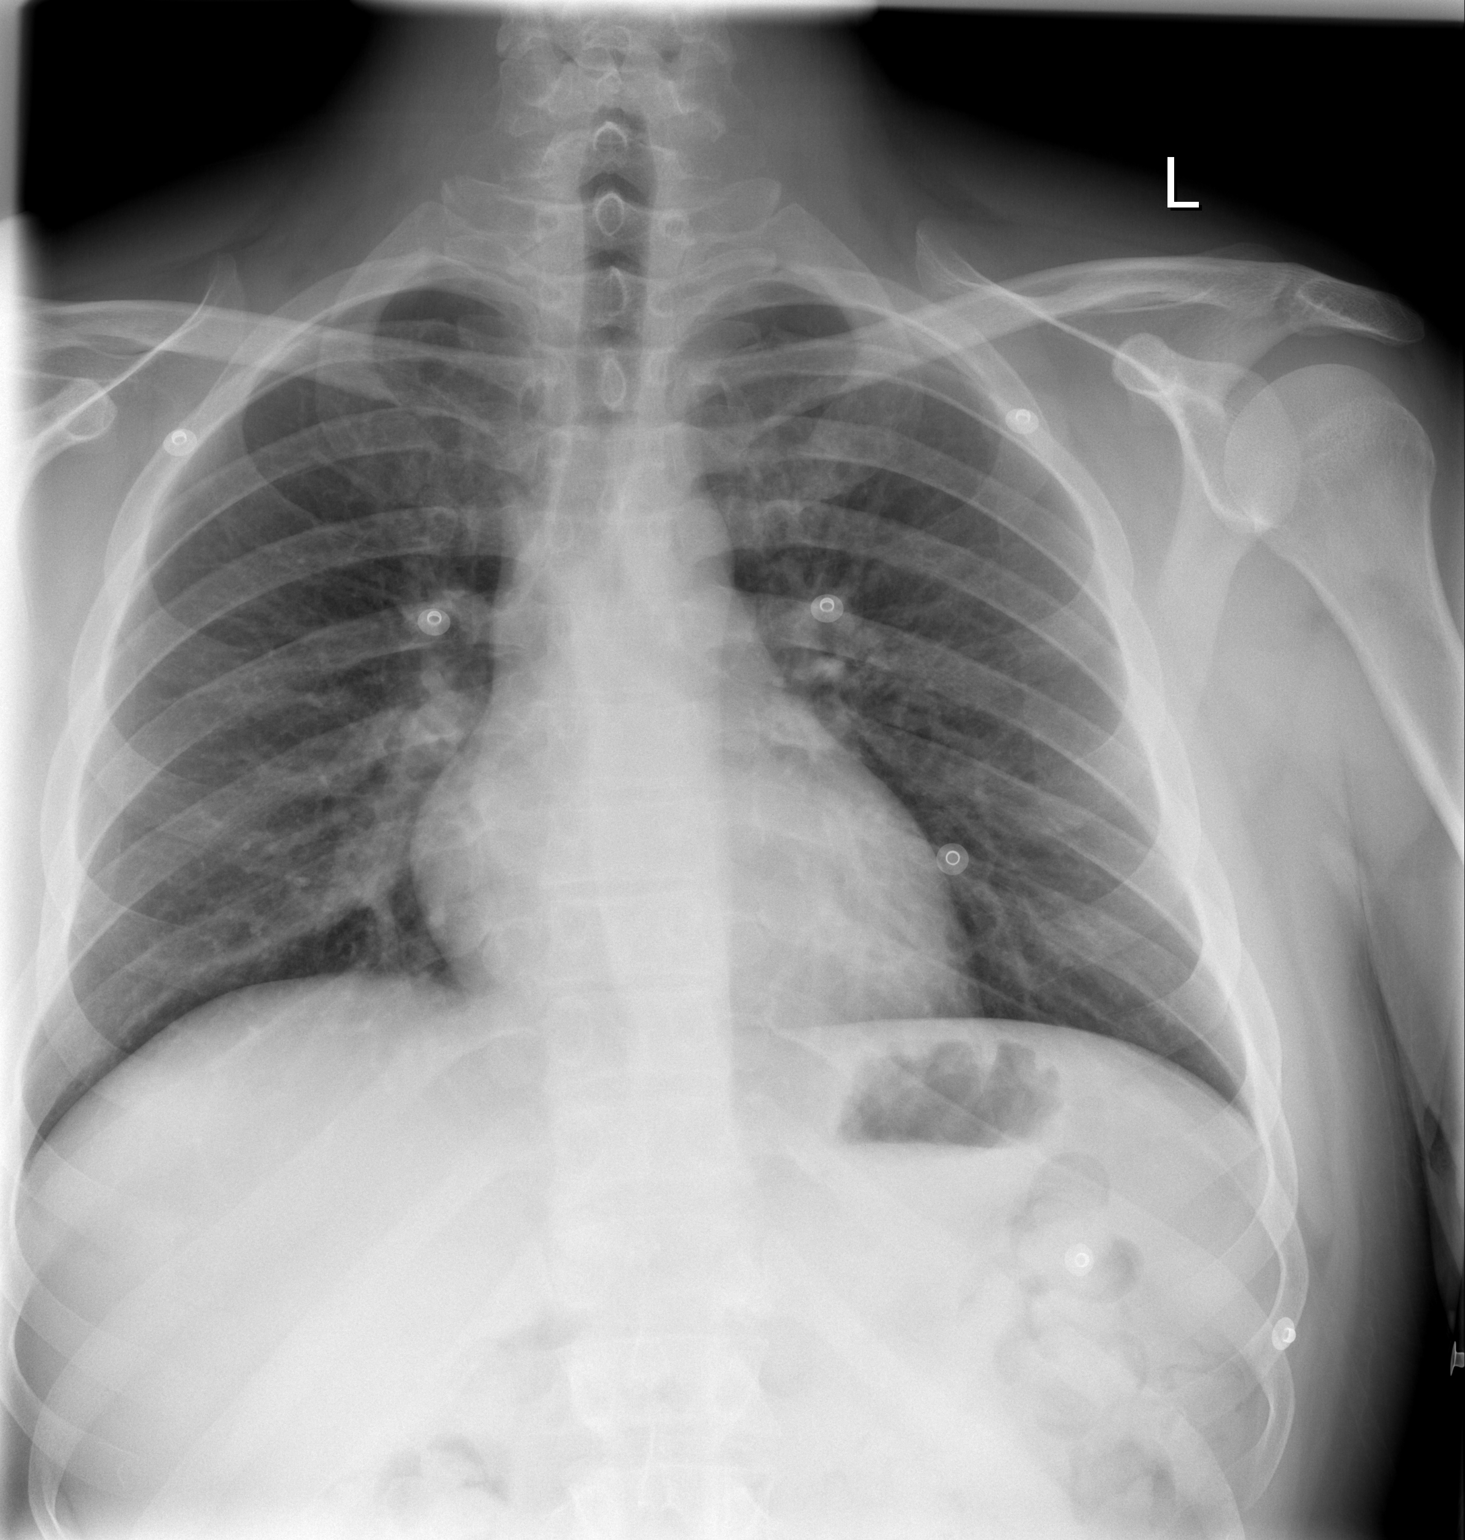

[w chest lat]
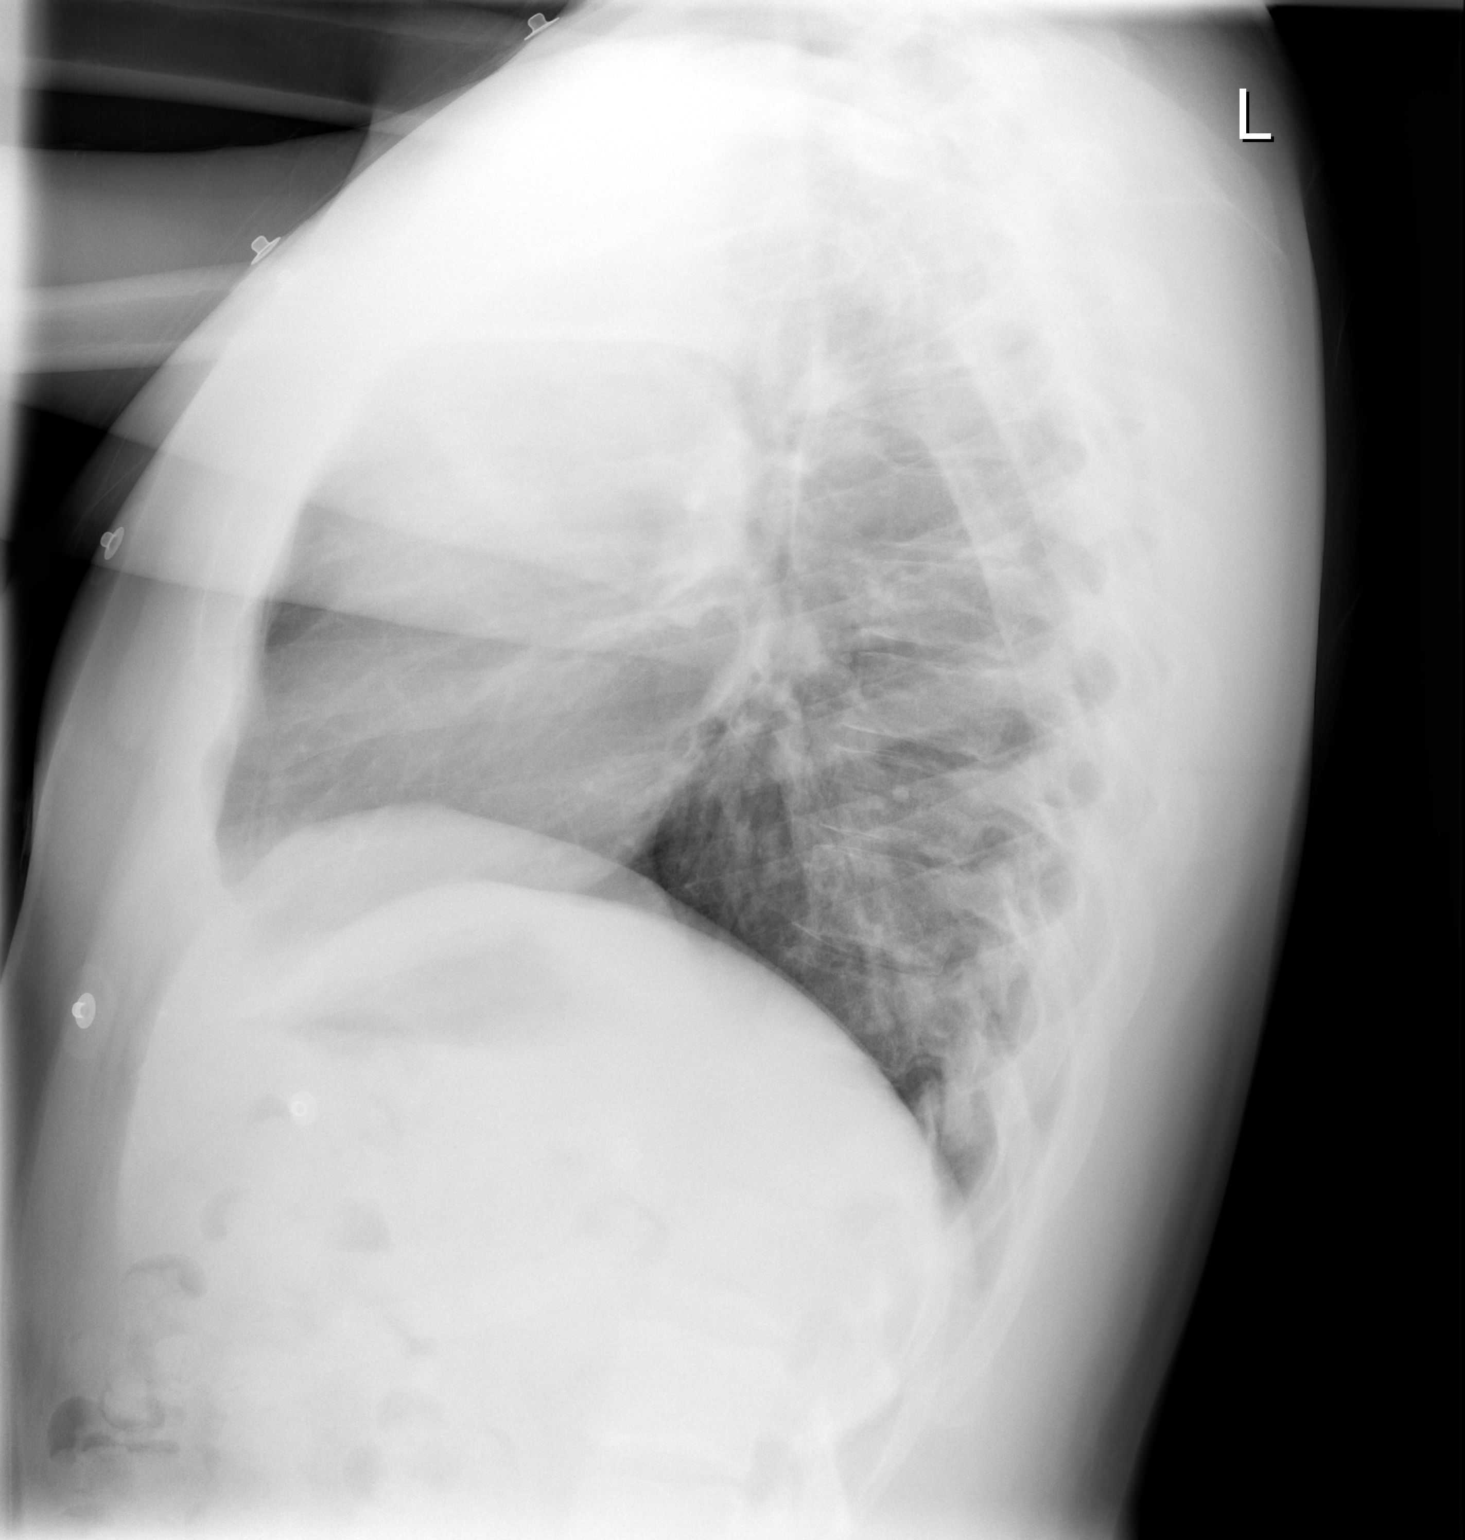

[2 of 2 positions shown; findings below may reference images not displayed]

FINDINGS: Normal mediastinum and cardiac silhouette.  Normal
pulmonary  vasculature.  No evidence of effusion, infiltrate, or
pneumothorax.  No acute bony abnormality.
IMPRESSION: No acute cardiopulmonary process.

## 2013-11-08 ENCOUNTER — Emergency Department (HOSPITAL_BASED_OUTPATIENT_CLINIC_OR_DEPARTMENT_OTHER)
Admission: EM | Admit: 2013-11-08 | Discharge: 2013-11-08 | Disposition: A | Discharge status: Home / Self Care | Payer: Managed Care, Other (non HMO) | Attending: Emergency Medicine | Admitting: Emergency Medicine

## 2013-11-08 ENCOUNTER — Encounter (HOSPITAL_BASED_OUTPATIENT_CLINIC_OR_DEPARTMENT_OTHER): Payer: Self-pay | Admitting: Emergency Medicine

## 2013-11-08 DIAGNOSIS — F17200 Nicotine dependence, unspecified, uncomplicated: Secondary | ICD-10-CM | POA: Insufficient documentation

## 2013-11-08 DIAGNOSIS — M26609 Unspecified temporomandibular joint disorder, unspecified side: Secondary | ICD-10-CM

## 2013-11-08 MED ORDER — PREDNISONE 10 MG PO TABS: 20.0000 mg | ORAL_TABLET | Freq: Two times a day (BID) | ORAL | Status: DC

## 2013-11-08 MED ORDER — HYDROCODONE-ACETAMINOPHEN 5-325 MG PO TABS: 2.0000 | ORAL_TABLET | ORAL | Status: DC | PRN

## 2013-11-08 NOTE — Discharge Instructions (Signed)
Prednisone as prescribed for the next 5 days.  Hydrocodone as prescribed as needed for pain.  Follow up with oral surgery if not improving in the next 5 days. The contact information has been provided on this discharge summary.  Return to the ER if your symptoms substantially worsen or change.   Temporomandibular Problems  Temporomandibular joint (TMJ) dysfunction means there are problems with the joint between your jaw and your skull. This is a joint lined by cartilage like other joints in your body but also has a small disc in the joint which keeps the bones from rubbing on each other. These joints are like other joints and can get inflamed (sore) from arthritis and other problems. When this joint gets sore, it can cause headaches and pain in the jaw and the face. CAUSES  Usually the arthritic types of problems are caused by soreness in the joint. Soreness in the joint can also be caused by overuse. This may come from grinding your teeth. It may also come from mis-alignment in the joint. DIAGNOSIS Diagnosis of this condition can often be made by history and exam. Sometimes your caregiver may need X-rays or an MRI scan to determine the exact cause. It may be necessary to see your dentist to determine if your teeth and jaws are lined up correctly. TREATMENT  Most of the time this problem is not serious; however, sometimes it can persist (become chronic). When this happens medications that will cut down on inflammation (soreness) help. Sometimes a shot of cortisone into the joint will be helpful. If your teeth are not aligned it may help for your dentist to make a splint for your mouth that can help this problem. If no physical problems can be found, the problem may come from tension. If tension is found to be the cause, biofeedback or relaxation techniques may be helpful. HOME CARE INSTRUCTIONS   Later in the day, applications of ice packs may be helpful. Ice can be used in a plastic bag with a  towel around it to prevent frostbite to skin. This may be used about every 2 hours for 20 to 30 minutes, as needed while awake, or as directed by your caregiver.  Only take over-the-counter or prescription medicines for pain, discomfort, or fever as directed by your caregiver.  If physical therapy was prescribed, follow your caregiver's directions.  Wear mouth appliances as directed if they were given. Document Released: 04/16/2001 Document Revised: 10/14/2011 Document Reviewed: 07/24/2008 [facility] Patient Information 2014 [place], [place].

## 2013-11-08 NOTE — ED Notes (Signed)
Pt reports (L) jaw pain x 2-3 weeks.  Denies dental pain.

## 2013-11-08 NOTE — ED Provider Notes (Signed)
CSN: [id number]     Arrival date & time 11/08/13  1228 History  This chart was scribed for Geoffery Lyons, MD by Smiley Houseman, ED Scribe. The patient was seen in room MH09/MH09. Patient's care was started at 3:13 PM.    Chief Complaint  Patient presents with  . Jaw Pain    The history is provided by the patient. No language interpreter was used.   HPI Comments: RACHID PARHAM is a 28 y.o. male who presents to the Emergency Department complaining of constant worsening left sided jaw pain that started about 3 weeks ago.  He states the pain is worse when he is chewing.  Pt states he can feel his jaw pop.  He denies any known injury to the area.  Pt states there is pain with palpation.  Pt states fluid has been draining from his left ear, but denies otalgia and hearing changes.  Pt states he has a bad tooth on the left lower side, but states it has been like that for a couple of years.  Pt has tried Aleve and Tylenol without relief.  He states he is otherwise healthy.    History reviewed. No pertinent past medical history. Past Surgical History  Procedure Laterality Date  . Foot tumor excision     History reviewed. No pertinent family history. History  Substance Use Topics  . Smoking status: Current Every Day Smoker -- 1.00 packs/day    Types: Cigarettes  . Smokeless tobacco: Not on file  . Alcohol Use: Yes     Comment: weekly    Review of Systems A complete 10 system review of systems was obtained and all systems are negative except as noted in the HPI and PMH.   Allergies  Review of patient's allergies indicates no known allergies.  Home Medications   Triage Vitals: BP 138/82  Pulse 66  Temp(Src) 98.9 F (37.2 C) (Oral)  Resp 16  SpO2 100%  Physical Exam  Nursing note and vitals reviewed. Constitutional: He is oriented to person, place, and time. He appears well-developed and well-nourished. No distress.  HENT:  Head: Normocephalic and atraumatic.  Right Ear: External  ear normal.  Left Ear: External ear normal.  Tenderness to palpation over the left TMJ.  There is some crepitus with opening and closing the mouth.  The dentition reveals a cavity in the left lower 1st molar, but no evidence of abscess or infection.    Eyes: Conjunctivae and EOM are normal. Right eye exhibits no discharge. Left eye exhibits no discharge.  Neck: Neck supple. No tracheal deviation present.  Cardiovascular: Normal rate.   Pulmonary/Chest: Effort normal. No respiratory distress.  Musculoskeletal: Normal range of motion.  Neurological: He is alert and oriented to person, place, and time.  Skin: Skin is warm and dry. No rash noted.  Psychiatric: He has a normal mood and affect. His behavior is normal. Judgment and thought content normal.    ED Course  Procedures (including critical care time) DIAGNOSTIC STUDIES: Oxygen Saturation is 100% on RA, normal by my interpretation.    COORDINATION OF CARE: 3:18 PM-Will discharge with prednisone and hydrocodone.  Patient informed of current plan of treatment and evaluation and agrees with plan.      MDM   Final diagnoses:  TMJ (temporomandibular joint syndrome)    This appears to be TMJ. We'll treat with prednisone, jaw rest, and followup with OMFS if not improving in the next week.    I personally performed the services described  in this documentation, which was scribed in my presence. The recorded information has been reviewed and is accurate.      Geoffery Lyonsouglas Hilery Wintle, MD 11/08/13 [phone]

## 2014-01-17 ENCOUNTER — Encounter (HOSPITAL_COMMUNITY): Payer: Self-pay | Admitting: Emergency Medicine

## 2014-01-17 ENCOUNTER — Emergency Department (INDEPENDENT_AMBULATORY_CARE_PROVIDER_SITE_OTHER)
Admission: EM | Admit: 2014-01-17 | Discharge: 2014-01-17 | Disposition: A | Discharge status: Home / Self Care | Payer: Self-pay | Source: Home / Self Care | Attending: Family Medicine | Admitting: Family Medicine

## 2014-01-17 DIAGNOSIS — R059 Cough, unspecified: Secondary | ICD-10-CM

## 2014-01-17 DIAGNOSIS — R05 Cough: Secondary | ICD-10-CM

## 2014-01-17 MED ORDER — AEROCHAMBER PLUS FLO-VU LARGE MISC: 1.0000 | Freq: Once | Status: DC

## 2014-01-17 MED ORDER — PREDNISONE 10 MG PO TABS: ORAL_TABLET | ORAL | Status: DC

## 2014-01-17 MED ORDER — GUAIFENESIN-CODEINE 100-10 MG/5ML PO SOLN: 5.0000 mL | ORAL | Status: DC | PRN

## 2014-01-17 MED ORDER — ALBUTEROL SULFATE HFA 108 (90 BASE) MCG/ACT IN AERS: 2.0000 | INHALATION_SPRAY | RESPIRATORY_TRACT | Status: DC | PRN

## 2014-01-17 MED ORDER — ALBUTEROL SULFATE HFA 108 (90 BASE) MCG/ACT IN AERS
INHALATION_SPRAY | RESPIRATORY_TRACT | Status: AC
Start: 2014-01-17 — End: 2014-01-17
  Filled 2014-01-17: qty 6.7

## 2014-01-17 NOTE — ED Provider Notes (Signed)
CSN: [id number]     Arrival date & time 01/17/14  1840 History   First MD Initiated Contact with Patient 01/17/14 2039     Chief Complaint  Patient presents with  . Cough   (Consider location/radiation/quality/duration/timing/severity/associated sxs/prior Treatment) HPI Comments: 28 year old male presents for evaluation of cough. For 4 days, he has had a progressively worsening cough. He has also had some rhinorrhea and sinus pressure but that got better when he started taking Zyrtec. His cough persists. He coughed so much that he had an episode of posttussive vomiting 2 days ago, and the cough is making his throat hurt. He has had pneumonia before, he does not feel near as sick as when he had pneumonia. He does not actually feel sick, he just cannot stop coughing. No fever, chills, chest pain, shortness of breath.  Patient is a 28 y.o. male presenting with cough.  Cough Associated symptoms: sore throat (from coughing)     History reviewed. No pertinent past medical history. Past Surgical History  Procedure Laterality Date  . Foot tumor excision     No family history on file. History  Substance Use Topics  . Smoking status: Current Every Day Smoker -- 1.00 packs/day    Types: Cigarettes  . Smokeless tobacco: Not on file  . Alcohol Use: Yes     Comment: weekly    Review of Systems  HENT: Positive for sore throat (from coughing).   Respiratory: Positive for cough.   All other systems reviewed and are negative.   Allergies  Review of patient's allergies indicates no known allergies.  Home Medications   Prior to Admission medications   Medication Sig Start Date End Date Taking? Authorizing Provider  guaiFENesin-codeine 100-10 MG/5ML syrup Take 5-10 mLs by mouth every 4 (four) hours as needed for cough. 01/17/14   Graylon GoodZachary H Arriah Wadle, PA-C  HYDROcodone-acetaminophen (NORCO) 5-325 MG per tablet Take 2 tablets by mouth every 4 (four) hours as needed. 11/08/13   Geoffery Lyonsouglas Delo, MD   predniSONE (DELTASONE) 10 MG tablet Take 2 tablets (20 mg total) by mouth 2 (two) times daily. 11/08/13   Geoffery Lyonsouglas Delo, MD  predniSONE (DELTASONE) 10 MG tablet 4 tabs PO QD for 4 days; 3 tabs PO QD for 3 days; 2 tabs PO QD for 2 days; 1 tab PO QD for 1 day 01/17/14   Graylon GoodZachary H Anush Wiedeman, PA-C   BP 136/77  Pulse 81  Temp(Src) 98 F (36.7 C) (Oral)  Resp 12  SpO2 99% Physical Exam  Nursing note and vitals reviewed. Constitutional: He is oriented to person, place, and time. He appears well-developed and well-nourished. No distress.  HENT:  Head: Normocephalic and atraumatic.  Right Ear: External ear normal.  Left Ear: External ear normal.  Nose: Nose normal.  Mouth/Throat: Oropharynx is clear and moist. No oropharyngeal exudate.  Eyes: Conjunctivae are normal. Right eye exhibits no discharge. Left eye exhibits no discharge.  Neck: Normal range of motion. Neck supple.  Cardiovascular: Normal rate, regular rhythm and normal heart sounds.  Exam reveals no gallop and no friction rub.   No murmur heard. Pulmonary/Chest: Effort normal and breath sounds normal. No respiratory distress. He has no wheezes. He has no rales.  Lymphadenopathy:    He has no cervical adenopathy.  Neurological: He is alert and oriented to person, place, and time. Coordination normal.  Skin: Skin is warm and dry. No rash noted. He is not diaphoretic.  Psychiatric: He has a normal mood and affect. Judgment normal.  ED Course  Procedures (including critical care time) Labs Review Labs Reviewed - No data to display  Imaging Review No results found.   MDM   1. Cough    Viral URI cough cough.  Treat symptomatically.  F/u if worsening   Meds ordered this encounter  Medications  . albuterol (PROVENTIL HFA;VENTOLIN HFA) 108 (90 BASE) MCG/ACT inhaler 2 puff    Sig:   . AEROCHAMBER PLUS FLO-VU LARGE MISC 1 each    Sig:   . guaiFENesin-codeine 100-10 MG/5ML syrup    Sig: Take 5-10 mLs by mouth every 4 (four) hours  as needed for cough.    Dispense:  200 mL    Refill:  0    Order Specific Question:  Supervising Provider    Answer:  Linna HoffKINDL, JAMES D [phone][[id number]]  . predniSONE (DELTASONE) 10 MG tablet    Sig: 4 tabs PO QD for 4 days; 3 tabs PO QD for 3 days; 2 tabs PO QD for 2 days; 1 tab PO QD for 1 day    Dispense:  30 tablet    Refill:  0    Order Specific Question:  Supervising Provider    Answer:  Bradd CanaryKINDL, JAMES D [[id number]]     Graylon GoodZachary H Raydon Chappuis, PA-C 01/17/14 [phone]

## 2014-01-17 NOTE — ED Notes (Signed)
Patient C/o cough onset 4 days ago. Patient reports he coughed until he vomited x 2 days ago. Has felt nauseous. Patient reports he takes Zyrtec daily.  Patient is alert and oriented and in no acute distress.

## 2014-01-17 NOTE — Discharge Instructions (Signed)

## 2014-01-18 NOTE — ED Provider Notes (Signed)
Medical screening examination/treatment/procedure(s) were performed by resident physician or non-physician practitioner and as supervising physician I was immediately available for consultation/collaboration.   Barkley BrunsKINDL,JAMES DOUGLAS MD.   Linna HoffJames D Kindl, MD 01/18/14 1723

## 2014-02-13 ENCOUNTER — Emergency Department (HOSPITAL_BASED_OUTPATIENT_CLINIC_OR_DEPARTMENT_OTHER): Payer: Self-pay

## 2014-02-13 ENCOUNTER — Encounter (HOSPITAL_BASED_OUTPATIENT_CLINIC_OR_DEPARTMENT_OTHER): Payer: Self-pay | Admitting: Emergency Medicine

## 2014-02-13 ENCOUNTER — Emergency Department (HOSPITAL_BASED_OUTPATIENT_CLINIC_OR_DEPARTMENT_OTHER)
Admission: EM | Admit: 2014-02-13 | Discharge: 2014-02-13 | Disposition: A | Discharge status: Home / Self Care | Payer: Self-pay | Attending: Emergency Medicine | Admitting: Emergency Medicine

## 2014-02-13 DIAGNOSIS — K0889 Other specified disorders of teeth and supporting structures: Secondary | ICD-10-CM

## 2014-02-13 DIAGNOSIS — F17200 Nicotine dependence, unspecified, uncomplicated: Secondary | ICD-10-CM | POA: Insufficient documentation

## 2014-02-13 DIAGNOSIS — K089 Disorder of teeth and supporting structures, unspecified: Secondary | ICD-10-CM | POA: Insufficient documentation

## 2014-02-13 DIAGNOSIS — Z87442 Personal history of urinary calculi: Secondary | ICD-10-CM | POA: Insufficient documentation

## 2014-02-13 DIAGNOSIS — Z792 Long term (current) use of antibiotics: Secondary | ICD-10-CM | POA: Insufficient documentation

## 2014-02-13 DIAGNOSIS — IMO0002 Reserved for concepts with insufficient information to code with codable children: Secondary | ICD-10-CM | POA: Insufficient documentation

## 2014-02-13 DIAGNOSIS — Z79899 Other long term (current) drug therapy: Secondary | ICD-10-CM | POA: Insufficient documentation

## 2014-02-13 HISTORY — DX: Calculus of kidney: N20.0

## 2014-02-13 NOTE — ED Provider Notes (Signed)
CSN: [id number]     Arrival date & time 02/13/14  2002 History   This chart was scribed for Glynn OctaveStephen Winnona Wargo, MD by Modena JanskyAlbert Thayil, ED Scribe. This patient was seen in room MHT13/MHT13 and the patient's care was started at 8:47 PM.  Chief Complaint  Patient presents with  . Oral Swelling   HPI HPI Comments: Ross Best is a 28 y.o. male who presents to the Emergency Department complaining of constant moderate oral swelling that started this morning.  He states that he has associated pain in his lower jaw and swelling in his throat. He reports that his tooth hurts also. He reports that he had a root canal 3 years ago and now it has started to give him trouble again.   Past Medical History  Diagnosis Date  . Kidney stone    Past Surgical History  Procedure Laterality Date  . Foot tumor excision     No family history on file. History  Substance Use Topics  . Smoking status: Current Every Day Smoker -- 1.00 packs/day    Types: Cigarettes  . Smokeless tobacco: Not on file  . Alcohol Use: Yes     Comment: occ    Review of Systems  A complete 10 system review of systems was obtained and all systems are negative except as noted in the HPI and PMH.   Allergies  Review of patient's allergies indicates no known allergies.  Home Medications   Prior to Admission medications   Medication Sig Start Date End Date Taking? Authorizing Provider  clindamycin (CLEOCIN) 150 MG capsule Take by mouth 3 (three) times daily.   Yes Historical Provider, MD  albuterol (PROVENTIL HFA;VENTOLIN HFA) 108 (90 BASE) MCG/ACT inhaler Inhale 2 puffs into the lungs every 4 (four) hours as needed (for cough). 01/17/14   Adrian BlackwaterZachary H Baker, PA-C  guaiFENesin-codeine 100-10 MG/5ML syrup Take 5-10 mLs by mouth every 4 (four) hours as needed for cough. 01/17/14   Graylon GoodZachary H Baker, PA-C  HYDROcodone-acetaminophen (NORCO) 5-325 MG per tablet Take 2 tablets by mouth every 4 (four) hours as needed. 11/08/13   Geoffery Lyonsouglas Delo, MD   predniSONE (DELTASONE) 10 MG tablet Take 2 tablets (20 mg total) by mouth 2 (two) times daily. 11/08/13   Geoffery Lyonsouglas Delo, MD  predniSONE (DELTASONE) 10 MG tablet 4 tabs PO QD for 4 days; 3 tabs PO QD for 3 days; 2 tabs PO QD for 2 days; 1 tab PO QD for 1 day 01/17/14   Graylon GoodZachary H Baker, PA-C  Spacer/Aero-Holding Chambers (AEROCHAMBER PLUS FLO-VU LARGE) MISC 1 each by Other route once. 01/17/14   Adrian BlackwaterZachary H Baker, PA-C   Pulse 79  Temp(Src) 98.1 F (36.7 C) (Oral)  Resp 18  Ht 6\' 1"  (1.854 m)  Wt 250 lb (113.399 kg)  BMI 32.99 kg/m2  SpO2 98% Physical Exam  Nursing note and vitals reviewed. Constitutional: He is oriented to person, place, and time. He appears well-developed and well-nourished. No distress.  HENT:  Head: Normocephalic and atraumatic.  Mouth/Throat: Oropharynx is clear and moist. No oropharyngeal exudate.  Left second molar is diffusely carious. Floor of mouth is soft. No trismus. No swelling of the tongue. No asymmetry.   Eyes: Conjunctivae and EOM are normal. Pupils are equal, round, and reactive to light.  Neck: Normal range of motion. Neck supple.  No meningismus.  Cardiovascular: Normal rate, regular rhythm, normal heart sounds and intact distal pulses.   No murmur heard. Pulmonary/Chest: Effort normal and breath sounds normal. No  respiratory distress.  Abdominal: Soft. There is no tenderness. There is no rebound and no guarding.  Musculoskeletal: Normal range of motion. He exhibits no edema and no tenderness.  Neurological: He is alert and oriented to person, place, and time. No cranial nerve deficit. He exhibits normal muscle tone. Coordination normal.  No ataxia on finger to nose bilaterally. No pronator drift. 5/5 strength throughout. CN 2-12 intact. Negative Romberg. Equal grip strength. Sensation intact. Gait is normal.   Skin: Skin is warm.  Psychiatric: He has a normal mood and affect. His behavior is normal.    ED Course  Procedures (including critical care  time) DIAGNOSTIC STUDIES: Oxygen Saturation is 98% on RA, normal by my interpretation.    COORDINATION OF CARE: 8:51 PM- Pt advised of plan for treatment which includes radiology and pt agrees.  Labs Review Labs Reviewed - No data to display  Imaging Review Dg Neck Soft Tissue  02/13/2014   CLINICAL DATA:  Oral swelling.  EXAM: NECK SOFT TISSUES - 1+ VIEW  COMPARISON:  None.  FINDINGS: There is no evidence of retropharyngeal soft tissue swelling or epiglottic enlargement. The cervical airway is unremarkable and no radio-opaque foreign body identified.  IMPRESSION: Negative.   Electronically Signed   By: Drusilla Kanner M.D.   On: 02/13/2014 21:24     EKG Interpretation None      MDM   Final diagnoses:  Pain, dental   Left lower molar dental pain for the past 5 days. Taking clindamycin and hydrocodone. Today developed worsening pain and difficulty swallowing. denies any shortness of breath or chest pain.   No evidence of airway compromise or allergic reaction. No wheezing. Floor of mouth soft. No ludwig's angina.  Discussed switching antibiotic as a precaution with patient.  He wants to continue same antibiotic.  He has no tongue or lip swelling, throat swelling, wheezing or rash.  Doubt allergic reaction. Follow up with dentist. Return to the ED with difficulty breathing, difficulty swallowing or any other concerns.  Glynn Octave, MD 02/13/14 [phone]

## 2014-02-13 NOTE — ED Notes (Signed)
Patient transported to X-ray 

## 2014-02-13 NOTE — Discharge Instructions (Signed)
Dental Pain There is no evidence of allergic reaction. Follow up with your doctor as scheduled. Return to the ED if you develop new or worsening symptoms. A tooth ache may be caused by cavities (tooth decay). Cavities expose the nerve of the tooth to air and hot or cold temperatures. It may come from an infection or abscess (also called a boil or furuncle) around your tooth. It is also often caused by dental caries (tooth decay). This causes the pain you are having. DIAGNOSIS  Your caregiver can diagnose this problem by exam. TREATMENT   If caused by an infection, it may be treated with medications which kill germs (antibiotics) and pain medications as prescribed by your caregiver. Take medications as directed.  Only take over-the-counter or prescription medicines for pain, discomfort, or fever as directed by your caregiver.  Whether the tooth ache today is caused by infection or dental disease, you should see your dentist as soon as possible for further care. SEEK MEDICAL CARE IF: The exam and treatment you received today has been provided on an emergency basis only. This is not a substitute for complete medical or dental care. If your problem worsens or new problems (symptoms) appear, and you are unable to meet with your dentist, call or return to this location. SEEK IMMEDIATE MEDICAL CARE IF:   You have a fever.  You develop redness and swelling of your face, jaw, or neck.  You are unable to open your mouth.  You have severe pain uncontrolled by pain medicine. MAKE SURE YOU:   Understand these instructions.  Will watch your condition.  Will get help right away if you are not doing well or get worse. Document Released: 07/22/2005 Document Revised: 10/14/2011 Document Reviewed: 03/09/2008 [facility] Patient Information 2015 [place], [place]. This information is not intended to replace advice given to you by your health care provider. Make sure you discuss any questions you have with your  health care provider.

## 2014-02-13 NOTE — ED Notes (Signed)
Pt reports started on antibiotic for dental abscess yesterday.  Started having feeling of swelling and difficulty breathing in his throat.  Reports feels neck swelling on left side, airway patent in triage.

## 2014-02-14 ENCOUNTER — Encounter (HOSPITAL_BASED_OUTPATIENT_CLINIC_OR_DEPARTMENT_OTHER): Payer: Self-pay | Admitting: Emergency Medicine

## 2014-02-14 ENCOUNTER — Emergency Department (HOSPITAL_BASED_OUTPATIENT_CLINIC_OR_DEPARTMENT_OTHER)
Admission: EM | Admit: 2014-02-14 | Discharge: 2014-02-14 | Disposition: A | Discharge status: Home / Self Care | Payer: Self-pay | Attending: Emergency Medicine | Admitting: Emergency Medicine

## 2014-02-14 DIAGNOSIS — IMO0002 Reserved for concepts with insufficient information to code with codable children: Secondary | ICD-10-CM | POA: Insufficient documentation

## 2014-02-14 DIAGNOSIS — Z792 Long term (current) use of antibiotics: Secondary | ICD-10-CM | POA: Insufficient documentation

## 2014-02-14 DIAGNOSIS — Z87442 Personal history of urinary calculi: Secondary | ICD-10-CM | POA: Insufficient documentation

## 2014-02-14 DIAGNOSIS — K029 Dental caries, unspecified: Secondary | ICD-10-CM | POA: Insufficient documentation

## 2014-02-14 DIAGNOSIS — F17200 Nicotine dependence, unspecified, uncomplicated: Secondary | ICD-10-CM | POA: Insufficient documentation

## 2014-02-14 DIAGNOSIS — R42 Dizziness and giddiness: Secondary | ICD-10-CM | POA: Insufficient documentation

## 2014-02-14 DIAGNOSIS — Z79899 Other long term (current) drug therapy: Secondary | ICD-10-CM | POA: Insufficient documentation

## 2014-02-14 MED ORDER — HYDROCODONE-ACETAMINOPHEN 5-325 MG PO TABS: 1.0000 | ORAL_TABLET | ORAL | Status: DC | PRN

## 2014-02-14 NOTE — ED Provider Notes (Signed)
CSN: [id number]     Arrival date & time 02/14/14  1034 History   First MD Initiated Contact with Patient 02/14/14 1041     Chief Complaint  Patient presents with  . Dental Pain     (Consider location/radiation/quality/duration/timing/severity/associated sxs/prior Treatment) Patient is a 28 y.o. male presenting with tooth pain. The history is provided by the patient. No language interpreter was used.  Dental Pain Location:  Lower Lower teeth location:  19/LL 1st molar Associated symptoms: no facial swelling and no fever   Associated symptoms comment:  Continued lower left molar pain that is currently being treated by a dentist with antibiotics and pending extraction next week. He was also rechecked in the ED last night without new finding or change in medications. His dentist increased his Clindamycin prescription yesterday and he feels no better. Today he feels some chest discomfort and dizziness when he stands. No syncope, SOB, vomiting.    Past Medical History  Diagnosis Date  . Kidney stone    Past Surgical History  Procedure Laterality Date  . Foot tumor excision     History reviewed. No pertinent family history. History  Substance Use Topics  . Smoking status: Current Every Day Smoker -- 1.00 packs/day    Types: Cigarettes  . Smokeless tobacco: Not on file  . Alcohol Use: Yes     Comment: occ    Review of Systems  Constitutional: Negative for fever and chills.  HENT: Negative for ear pain, facial swelling and trouble swallowing.        Toothache.  Respiratory: Negative for shortness of breath.   Cardiovascular: Negative for chest pain.  Gastrointestinal: Negative.   Musculoskeletal: Negative for myalgias.  Neurological: Positive for dizziness.      Allergies  Review of patient's allergies indicates no known allergies.  Home Medications   Prior to Admission medications   Medication Sig Start Date End Date Taking? Authorizing Provider  albuterol (PROVENTIL  HFA;VENTOLIN HFA) 108 (90 BASE) MCG/ACT inhaler Inhale 2 puffs into the lungs every 4 (four) hours as needed (for cough). 01/17/14   Adrian Blackwater Baker, PA-C  clindamycin (CLEOCIN) 150 MG capsule Take by mouth 3 (three) times daily.    Historical Provider, MD  guaiFENesin-codeine 100-10 MG/5ML syrup Take 5-10 mLs by mouth every 4 (four) hours as needed for cough. 01/17/14   Graylon Good, PA-C  HYDROcodone-acetaminophen (NORCO) 5-325 MG per tablet Take 2 tablets by mouth every 4 (four) hours as needed. 11/08/13   Geoffery Lyons, MD  predniSONE (DELTASONE) 10 MG tablet Take 2 tablets (20 mg total) by mouth 2 (two) times daily. 11/08/13   Geoffery Lyons, MD  predniSONE (DELTASONE) 10 MG tablet 4 tabs PO QD for 4 days; 3 tabs PO QD for 3 days; 2 tabs PO QD for 2 days; 1 tab PO QD for 1 day 01/17/14   Graylon Good, PA-C  Spacer/Aero-Holding Chambers (AEROCHAMBER PLUS FLO-VU LARGE) MISC 1 each by Other route once. 01/17/14   Adrian Blackwater Baker, PA-C   BP 144/102  Pulse 87  Temp(Src) 98.1 F (36.7 C) (Oral)  Resp 16  Ht 6\' 1"  (1.854 m)  Wt 240 lb (108.863 kg)  BMI 31.67 kg/m2  SpO2 96% Physical Exam  Constitutional: He is oriented to person, place, and time. He appears well-developed and well-nourished.  HENT:  Significant decay to number 19. No surrounding swelling or visualized abscess. No submental adenopathy.   Neck: Normal range of motion.  Pulmonary/Chest: Effort normal. No respiratory distress.  Musculoskeletal: Normal range of motion.  Neurological: He is alert and oriented to person, place, and time.  Skin: Skin is warm and dry.  Psychiatric: He has a normal mood and affect.    ED Course  Procedures (including critical care time) Labs Review Labs Reviewed - No data to display  Imaging Review Dg Neck Soft Tissue  02/13/2014   CLINICAL DATA:  Oral swelling.  EXAM: NECK SOFT TISSUES - 1+ VIEW  COMPARISON:  None.  FINDINGS: There is no evidence of retropharyngeal soft tissue swelling or  epiglottic enlargement. The cervical airway is unremarkable and no radio-opaque foreign body identified.  IMPRESSION: Negative.   Electronically Signed   By: Drusilla Kannerhomas  Dalessio M.D.   On: 02/13/2014 21:24     EKG Interpretation None      MDM   Final diagnoses:  None    1. Dental caries  Suspect chest discomfort due to GI causes in the setting of doubling dose of Clinda. EKG normal, minimal risk factors for cardiac event. No fever. Doubt endocarditis secondary to dental infection. Encouraged to continue medications - discussed pain due to decay vs infection. Curative treatment will be the extraction scheduled for next week.     Arnoldo HookerShari A Zayne Marovich, PA-C 02/14/14 1117

## 2014-02-14 NOTE — ED Notes (Signed)
Pt reports dental pain in the left lower jaw x several days.  Was seen last night for same.  Reports dizziness and increased pain this morning.

## 2014-02-14 NOTE — Discharge Instructions (Signed)
Dental Caries  Dental caries (also called tooth decay) is the most common oral disease. It can occur at any age, but is more common in children and young adults.  HOW DENTAL CARIES DEVELOPS  The process of decay begins when bacteria and foods (particularly sugars and starches) combine in your mouth to produce plaque. Plaque is a substance that sticks to the hard, outer surface of a tooth (enamel). The bacteria in plaque produce acids that attack enamel. These acids may also attack the root surface of a tooth (cementum) if it is exposed. Repeated attacks dissolve these surfaces and create holes in the tooth (cavities). If left untreated, the acids destroy the other layers of the tooth.  RISK FACTORS  Frequent sipping of sugary beverages.   Frequent snacking on sugary and starchy foods, especially those that easily get stuck in the teeth.   Poor oral hygiene.   Dry mouth.   Substance abuse such as methamphetamine abuse.   Broken or poor-fitting dental restorations.   Eating disorders.   Gastroesophageal reflux disease (GERD).   Certain radiation treatments to the head and neck. SYMPTOMS In the early stages of dental caries, symptoms are seldom present. Sometimes white, chalky areas may be seen on the enamel or other tooth layers. In later stages, symptoms may include:  Pits and holes on the enamel.  Toothache after sweet, hot, or cold foods or drinks are consumed.  Pain around the tooth.  Swelling around the tooth. DIAGNOSIS  Most of the time, dental caries is detected during a regular dental checkup. A diagnosis is made after a thorough medical and dental history is taken and the surfaces of your teeth are checked for signs of dental caries. Sometimes special instruments, such as lasers, are used to check for dental caries. Dental X-ray exams may be taken so that areas not visible to the eye (such as between the contact areas of the teeth) can be checked for cavities.   TREATMENT  If dental caries is in its early stages, it may be reversed with a fluoride treatment or an application of a remineralizing agent at the dental office. Thorough brushing and flossing at home is needed to aid these treatments. If it is in its later stages, treatment depends on the location and extent of tooth destruction:   If a small area of the tooth has been destroyed, the destroyed area will be removed and cavities will be filled with a material such as gold, silver amalgam, or composite resin.   If a large area of the tooth has been destroyed, the destroyed area will be removed and a cap (crown) will be fitted over the remaining tooth structure.   If the center part of the tooth (pulp) is affected, a procedure called a root canal will be needed before a filling or crown can be placed.   If most of the tooth has been destroyed, the tooth may need to be pulled (extracted). HOME CARE INSTRUCTIONS You can prevent, stop, or reverse dental caries at home by practicing good oral hygiene. Good oral hygiene includes:  Thoroughly cleaning your teeth at least twice a day with a toothbrush and dental floss.   Using a fluoride toothpaste. A fluoride mouth rinse may also be used if recommended by your dentist or health care provider.   Restricting the amount of sugary and starchy foods and sugary liquids you consume.   Avoiding frequent snacking on these foods and sipping of these liquids.   Keeping regular visits with   a dentist for checkups and cleanings. PREVENTION   Practice good oral hygiene.  Consider a dental sealant. A dental sealant is a coating material that is applied by your dentist to the pits and grooves of teeth. The sealant prevents food from being trapped in them. It may protect the teeth for several years.  Ask about fluoride supplements if you live in a community without fluorinated water or with water that has a low fluoride content. Use fluoride supplements  as directed by your dentist or health care provider.  Allow fluoride varnish applications to teeth if directed by your dentist or health care provider. Document Released: 04/13/2002 Document Revised: 03/24/2013 Document Reviewed: 07/24/2012 [employer] Patient Information 2015 [employer]. This information is not intended to replace advice given to you by your health care provider. Make sure you discuss any questions you have with your health care provider.  

## 2014-02-15 NOTE — ED Provider Notes (Signed)
Medical screening examination/treatment/procedure(s) were performed by non-physician practitioner and as supervising physician I was immediately available for consultation/collaboration.   Nana Vastine T Semir Brill, MD 02/15/14 1012 

## 2014-10-08 ENCOUNTER — Emergency Department (HOSPITAL_BASED_OUTPATIENT_CLINIC_OR_DEPARTMENT_OTHER)
Admission: EM | Admit: 2014-10-08 | Discharge: 2014-10-08 | Disposition: A | Discharge status: Home / Self Care | Payer: Self-pay | Attending: Emergency Medicine | Admitting: Emergency Medicine

## 2014-10-08 ENCOUNTER — Encounter (HOSPITAL_BASED_OUTPATIENT_CLINIC_OR_DEPARTMENT_OTHER): Payer: Self-pay

## 2014-10-08 DIAGNOSIS — Z792 Long term (current) use of antibiotics: Secondary | ICD-10-CM | POA: Insufficient documentation

## 2014-10-08 DIAGNOSIS — Z87442 Personal history of urinary calculi: Secondary | ICD-10-CM | POA: Insufficient documentation

## 2014-10-08 DIAGNOSIS — Z7952 Long term (current) use of systemic steroids: Secondary | ICD-10-CM | POA: Insufficient documentation

## 2014-10-08 DIAGNOSIS — Z720 Tobacco use: Secondary | ICD-10-CM | POA: Insufficient documentation

## 2014-10-08 DIAGNOSIS — J0100 Acute maxillary sinusitis, unspecified: Secondary | ICD-10-CM | POA: Insufficient documentation

## 2014-10-08 DIAGNOSIS — Z79899 Other long term (current) drug therapy: Secondary | ICD-10-CM | POA: Insufficient documentation

## 2014-10-08 MED ORDER — DOXYCYCLINE HYCLATE 100 MG PO CAPS: 100.0000 mg | ORAL_CAPSULE | Freq: Two times a day (BID) | ORAL | Status: DC

## 2014-10-08 NOTE — ED Provider Notes (Signed)
CSN: [id number]     Arrival date & time 10/08/14  1436 History   First MD Initiated Contact with Patient 10/08/14 1529     Chief Complaint  Patient presents with  . Facial Pain     (Consider location/radiation/quality/duration/timing/severity/associated sxs/prior Treatment) HPI Ross Best is a 29 year old male with no past medical history who presents the ER complaining of facial pressure, cough congestion. Patient states he began having symptoms of an upper respiratory infection approximate 5 days ago. He reports improvement of his cough, and states in the last 24-48 hours he has had worsening of his facial pain and pressure, with discolored mucus. Patient reports subjective fevers at home, and has not taken his temperature. Patient denies dizziness, headache, weakness, blurred vision, chest pain, shortness of breath, nausea, vomiting, diarrhea, abdominal pain, dysuria.  Past Medical History  Diagnosis Date  . Kidney stone    Past Surgical History  Procedure Laterality Date  . Foot tumor excision     No family history on file. History  Substance Use Topics  . Smoking status: Current Every Day Smoker -- 1.00 packs/day    Types: Cigarettes  . Smokeless tobacco: Not on file  . Alcohol Use: Yes     Comment: occ    Review of Systems  Constitutional: Negative for fever.  HENT: Negative for sore throat, trouble swallowing and voice change.   Eyes: Negative for visual disturbance.  Respiratory: Negative for shortness of breath.   Cardiovascular: Negative for chest pain.  Gastrointestinal: Negative for nausea, vomiting and abdominal pain.  Genitourinary: Negative for dysuria.  Skin: Negative for rash.  Neurological: Negative for dizziness, syncope, weakness, numbness and headaches.  Psychiatric/Behavioral: Negative.       Allergies  Review of patient's allergies indicates no known allergies.  Home Medications   Prior to Admission medications   Medication Sig Start Date  End Date Taking? Authorizing Provider  albuterol (PROVENTIL HFA;VENTOLIN HFA) 108 (90 BASE) MCG/ACT inhaler Inhale 2 puffs into the lungs every 4 (four) hours as needed (for cough). 01/17/14   Adrian BlackwaterZachary H Baker, PA-C  clindamycin (CLEOCIN) 150 MG capsule Take by mouth 3 (three) times daily.    Historical Provider, MD  doxycycline (VIBRAMYCIN) 100 MG capsule Take 1 capsule (100 mg total) by mouth 2 (two) times daily. One po bid x 7 days 10/08/14   Monte FantasiaJoseph W Shareef Eddinger, PA-C  guaiFENesin-codeine 100-10 MG/5ML syrup Take 5-10 mLs by mouth every 4 (four) hours as needed for cough. 01/17/14   Graylon GoodZachary H Baker, PA-C  HYDROcodone-acetaminophen (NORCO) 5-325 MG per tablet Take 1-2 tablets by mouth every 4 (four) hours as needed. 02/14/14   Shari A Upstill, PA-C  predniSONE (DELTASONE) 10 MG tablet Take 2 tablets (20 mg total) by mouth 2 (two) times daily. 11/08/13   Geoffery Lyonsouglas Delo, MD  predniSONE (DELTASONE) 10 MG tablet 4 tabs PO QD for 4 days; 3 tabs PO QD for 3 days; 2 tabs PO QD for 2 days; 1 tab PO QD for 1 day 01/17/14   Graylon GoodZachary H Baker, PA-C  Spacer/Aero-Holding Chambers (AEROCHAMBER PLUS FLO-VU LARGE) MISC 1 each by Other route once. 01/17/14   Adrian BlackwaterZachary H Baker, PA-C   BP 130/72 mmHg  Pulse 89  Temp(Src) 99.4 F (37.4 C) (Oral)  Resp 16  Ht 6\' 1"  (1.854 m)  Wt 260 lb (117.935 kg)  BMI 34.31 kg/m2  SpO2 95% Physical Exam  Constitutional: He is oriented to person, place, and time. He appears well-developed and well-nourished. No distress.  HENT:  Head: Normocephalic and atraumatic.  Right Ear: Tympanic membrane normal.  Left Ear: Tympanic membrane normal.  Nose: Nose normal.  Mouth/Throat: Uvula is midline, oropharynx is clear and moist and mucous membranes are normal. No trismus in the jaw. No uvula swelling. No oropharyngeal exudate, posterior oropharyngeal edema, posterior oropharyngeal erythema or tonsillar abscesses.  Right near normal. Left near with moderate amount of erythema and swelling to nasal  turbinate with yellow/green mucopurulent discharge.  Eyes: Right eye exhibits no discharge. Left eye exhibits no discharge. No scleral icterus.  Neck: Normal range of motion.  Cardiovascular: Normal rate and regular rhythm.   Pulmonary/Chest: Effort normal and breath sounds normal. No respiratory distress. He has no wheezes. He has no rales. He exhibits no tenderness.  Musculoskeletal: Normal range of motion.  Neurological: He is alert and oriented to person, place, and time.  Skin: Skin is warm and dry. He is not diaphoretic.  Psychiatric: He has a normal mood and affect.  Nursing note and vitals reviewed.   ED Course  Procedures (including critical care time) Labs Review Labs Reviewed - No data to display  Imaging Review No results found.   EKG Interpretation None      MDM   Final diagnoses:  Acute maxillary sinusitis, recurrence not specified    Patient complaining of symptoms of sinusitis.    Severe symptoms have been present for 5 days with worsening of symptoms past 48 hours, with purulent nasal discharge and maxillary sinus pain.  Concern for acute bacterial rhinosinusitis.  Patient discharged with Doxycycline.  Instructions given for warm saline nasal wash and recommendations for follow-up with primary care physician.  I discussed return precautions with patient, and patient verbalizes understanding and agreement with this plan. I encouraged patient to call or return to ER should he have any questions or concerns.  BP 130/72 mmHg  Pulse 89  Temp(Src) 99.4 F (37.4 C) (Oral)  Resp 16  Ht  (1.854 m)  Wt 260 lb (117.935 kg)  BMI 34.31 kg/m2  SpO2 95%  Signed,  Ladona Mow, PA-C 4:47 PM      Monte Fantasia, PA-C 10/09/14 1647  Noel Gerold, MD 10/12/14 [phone]

## 2014-10-08 NOTE — ED Notes (Signed)
Pt reports 5 days ago initially with uri symptoms, cough, congestion, diarrhea and subjective fever.  Now all symptoms cleared but still with mild cough and sinus pressure.  Reports sinus congestion and pressure are why he is here.

## 2014-10-08 NOTE — Discharge Instructions (Signed)
Sinusitis °Sinusitis is redness, soreness, and inflammation of the paranasal sinuses. Paranasal sinuses are air pockets within the bones of your face (beneath the eyes, the middle of the forehead, or above the eyes). In healthy paranasal sinuses, mucus is able to drain out, and air is able to circulate through them by way of your nose. However, when your paranasal sinuses are inflamed, mucus and air can become trapped. This can allow bacteria and other germs to grow and cause infection. °Sinusitis can develop quickly and last only a short time (acute) or continue over a long period (chronic). Sinusitis that lasts for more than 12 weeks is considered chronic.  °CAUSES  °Causes of sinusitis include: °· Allergies. °· Structural abnormalities, such as displacement of the cartilage that separates your nostrils (deviated septum), which can decrease the air flow through your nose and sinuses and affect sinus drainage. °· Functional abnormalities, such as when the small hairs (cilia) that line your sinuses and help remove mucus do not work properly or are not present. °SIGNS AND SYMPTOMS  °Symptoms of acute and chronic sinusitis are the same. The primary symptoms are pain and pressure around the affected sinuses. Other symptoms include: °· Upper toothache. °· Earache. °· Headache. °· Bad breath. °· Decreased sense of smell and taste. °· A cough, which worsens when you are lying flat. °· Fatigue. °· Fever. °· Thick drainage from your nose, which often is green and may contain pus (purulent). °· Swelling and warmth over the affected sinuses. °DIAGNOSIS  °Your health care provider will perform a physical exam. During the exam, your health care provider may: °· Look in your nose for signs of abnormal growths in your nostrils (nasal polyps). °· Tap over the affected sinus to check for signs of infection. °· View the inside of your sinuses (endoscopy) using an imaging device that has a light attached (endoscope). °If your health  care provider suspects that you have chronic sinusitis, one or more of the following tests may be recommended: °· Allergy tests. °· Nasal culture. A sample of mucus is taken from your nose, sent to a lab, and screened for bacteria. °· Nasal cytology. A sample of mucus is taken from your nose and examined by your health care provider to determine if your sinusitis is related to an allergy. °TREATMENT  °Most cases of acute sinusitis are related to a viral infection and will resolve on their own within 10 days. Sometimes medicines are prescribed to help relieve symptoms (pain medicine, decongestants, nasal steroid sprays, or saline sprays).  °However, for sinusitis related to a bacterial infection, your health care provider will prescribe antibiotic medicines. These are medicines that will help kill the bacteria causing the infection.  °Rarely, sinusitis is caused by a fungal infection. In theses cases, your health care provider will prescribe antifungal medicine. °For some cases of chronic sinusitis, surgery is needed. Generally, these are cases in which sinusitis recurs more than 3 times per year, despite other treatments. °HOME CARE INSTRUCTIONS  °· Drink plenty of water. Water helps thin the mucus so your sinuses can drain more easily. °· Use a humidifier. °· Inhale steam 3 to 4 times a day (for example, sit in the bathroom with the shower running). °· Apply a warm, moist washcloth to your face 3 to 4 times a day, or as directed by your health care provider. °· Use saline nasal sprays to help moisten and clean your sinuses. °· Take medicines only as directed by your health care provider. °·   If you were prescribed either an antibiotic or antifungal medicine, finish it all even if you start to feel better. °SEEK IMMEDIATE MEDICAL CARE IF: °· You have increasing pain or severe headaches. °· You have nausea, vomiting, or drowsiness. °· You have swelling around your face. °· You have vision problems. °· You have a stiff  neck. °· You have difficulty breathing. °MAKE SURE YOU:  °· Understand these instructions. °· Will watch your condition. °· Will get help right away if you are not doing well or get worse. °Document Released: 07/22/2005 Document Revised: 12/06/2013 Document Reviewed: 08/06/2011 °ExitCare® Patient Information ©2015 [employer]. This information is not intended to replace advice given to you by your health care provider. Make sure you discuss any questions you have with your health care provider. ° °Emergency Department Resource Guide °1) Find a Doctor and Pay Out of Pocket °Although you won't have to find out who is covered by your insurance plan, it is a good idea to ask around and get recommendations. You will then need to call the office and see if the doctor you have chosen will accept you as a new patient and what types of options they offer for patients who are self-pay. Some doctors offer discounts or will set up payment plans for their patients who do not have insurance, but you will need to ask so you aren't surprised when you get to your appointment. ° °2) Contact Your Local Health Department °Not all health departments have doctors that can see patients for sick visits, but many do, so it is worth a call to see if yours does. If you don't know where your local health department is, you can check in your phone book. The CDC also has a tool to help you locate your state's health department, and many state websites also have listings of all of their local health departments. ° °3) Find a Walk-in Clinic °If your illness is not likely to be very severe or complicated, you may want to try a walk in clinic. These are popping up all over the country in pharmacies, drugstores, and shopping centers. They're usually staffed by nurse practitioners or physician assistants that have been trained to treat common illnesses and complaints. They're usually fairly quick and inexpensive. However, if you have serious medical  issues or chronic medical problems, these are probably not your best option. ° °No Primary Care Doctor: °- Call [facility] at  832-8000 - they can help you locate a primary care doctor that  accepts your insurance, provides certain services, etc. °- Physician Referral Service- 1-800-533-3463 ° °Chronic Pain Problems: °Organization         Address  Phone   Notes  ° [facility]  (336) 297-2271 Patients need to be referred by their primary care doctor.  ° °Medication Assistance: °Organization         Address  Phone   Notes  °[place] [facility] [address] °[place], [place] [address] (336) 641-8030 --Must be a resident of [place] °-- Must have NO insurance coverage whatsoever (no Medicaid/ Medicare, etc.) °-- The pt. MUST have a primary care doctor that directs their care regularly and follows them in the community °  °[facility]  (866) 331-1348   °[employer]  (888) 892-1162   ° °Agencies that provide inexpensive medical care: °Organization         Address  Phone   Notes  °[facility]  (336)   832-8035   °[facility]    (336) 832-7272   °[facility] [address] °[place], [place] [address] (336) 832-4777   °[facility] [address], °[place] (336) 271-4999   °[facility]    (336) 373-0678   °[facility]    (336) 272-1050   °[facility] ° [address], [place] Phone:  (336) 832-4444, Fax:  (336) 832-4440 Hours of Operation:  9 am - 6 pm, M-F.  Also accepts Medicaid/Medicare and self-pay.  °[facility] ° [address], [place] Phone: (336) 832-3150, Fax: (336) 832-3151. Hours of Operation:  8:30 am - 5:30 pm, M-F.  Also accepts Medicaid and self-pay.  °[facility] [place] [address], [place] Phone: (336) 878-6027   °[facility] [address], [place], [place]  (336)723-1848, Ext. 123 Mondays & Thursdays: 7-9 AM.  First 15 patients are seen on a first come, first serve basis. °  ° °Medicaid-accepting [place] Providers: ° °Organization         Address  Phone   Notes  °[facility] [address], [place] (336) 641-2100 Also accepts self-pay patients.  °[facility] [address], [place] ° (336) 856-9996   °[facility] [address], [place] (336) 288-8857   °[facility] [address] [place] [address], [place] (336) 299-7000   °Veita Bland [address], [place]  ° (336) 373-1557 Only accepts [place] Access Medicaid patients after they have their name applied to their card.  ° °Self-Pay (no insurance) in [place]: ° °Organization         Address  Phone   Notes  °Sickle Cell Patients, [facility] [address], [place] (336) 832-1970   °[facility] [address], [place] (336) 832-4400   °[facility] ° [address], [place] (336) 992-4800   °[facility]/Dr. Osei-Bonsu ° [address] [place] [address], [place] or [address], [place] (336) 841-8500 Phone number for both [place] and [place] locations is the same.  °[facility] [address], [place] (336) 299-0000   °[facility] [address] [place] [address], [place] or [address] (336) 852-7530 °(336) 878-2260   °[facility] [address], [place] (336) 350-1642, phone; (336) 294-5005, fax Sees patients 1st and 3rd Saturday of every month.  Must not qualify for public or private insurance (i.e. Medicaid, Medicare, [place] Health Choice, Veterans' Benefits) • Household income should be no more than 200% of the poverty level •The clinic cannot treat you if you are pregnant or think you are pregnant • Sexually transmitted  diseases are not treated at the clinic.  ° ° °Dental Care: °Organization         Address  Phone  Notes  °[facility] [address], [place] (336) 641-6152 Accepts children up to age 21 who are enrolled in Medicaid or [place] Health Choice; pregnant women with a Medicaid card; and children who have applied for Medicaid or [place] Health Choice, but were declined, whose parents can pay a reduced fee at time of service.  °[facility] [place]  [address], [place] (336) 641-7733 Accepts children up to age 21 who are enrolled in Medicaid or [place] Health Choice; pregnant women with a Medicaid card; and children who have applied for Medicaid or [place] Health Choice, but were declined, whose parents can pay a reduced fee at time of service.  °[facility] ° [address], [place] (336) 641-4533 Patients are seen by appointment only. Walk-ins are not accepted. [facility] will see patients 18 years of age and older. °Monday - Tuesday (8am-5pm) °Most Wednesdays (8:30-5pm) °$30 per visit, cash only  °[facility] ° [address], [place] (336) 641-4533 Patients are seen by appointment only. Walk-ins are not accepted. [facility] will see patients 18 years of age and older. °One Wednesday Evening (Monthly: Volunteer Based).  $30 per visit, cash only  °[facility]  (919) 537-3737 for adults; Children under age 4, call Graduate Pediatric Dentistry at (919) [phone]. Children aged 4-14, please call (919) [phone] to request a pediatric application. ° Dental services are provided in all areas of dental care including fillings, crowns and bridges, complete and partial dentures, implants, gum treatment, root canals, and extractions. Preventive care is also provided. Treatment is provided to both adults and children. °Patients are selected via a  lottery and there is often a waiting list. °  °[facility] [address], °[place] ° (336) 763-8833 [email or web address] °  °[facility] [address], [place], [place] (336)723-1848, Ext. 123 Second and Fourth Thursday of each month, opens at 6:30 AM; Clinic ends at 9 AM.  Patients are seen on a first-come first-served basis, and a limited number are seen during each clinic.  ° °[facility] ° [address], [place], [place] (336) 723-7904   Eligibility Requirements °You must have lived in [place], [place], or [place] counties for at least the last three months. °  You cannot be eligible for state or federal sponsored healthcare insurance, including Veterans Administration, Medicaid, or Medicare. °  You generally cannot be eligible for healthcare insurance through your employer.  °  How to apply: °Eligibility screenings are held every Tuesday and Wednesday afternoon from 1:00 pm until 4:00 pm. You do not need an appointment for the interview!  °[facility] [address], [place], [place] 336-631-2330   °[facility]  336-342-8273   °[facility]  336-703-3100   °[facility]  336-570-6415   ° °Behavioral Health Resources in the Community: °Intensive Outpatient Programs °Organization         Address  Phone  Notes  °[facility] [address], [place], [place] 336-878-6098   °[facility] [address], [place], [place] 336-832-9800   °[facility] [address], [place], [place] ° 336-882-2125   °[facility] [address],  °[place], [place] 1-800-853-5163 or 336-641-4981   °Substance Abuse Resources °Organization         Address  Phone  Notes  °[facility]  336-882-2125   °[facility]  336-784-9470   °[facility]  336-285-9073   °[facility]  336-845-3988   °Residential &  Outpatient Substance Abuse Program  1-800-659-3381   °Psychological Services °Organization         Address  Phone  Notes  °[facility]  336- 832-9600   °[facility]  336- 378-7881   °[facility] [address], [place] 1-800-853-5163 or 336-641-4981   ° °Mobile Crisis Teams °Organization         Address  Phone  Notes  °[facility], Mobile Crisis Care Unit  1-877-626-1772   °Assertive °[facility] ° [address] [place], [place] 336-834-9664   °Sharon DeEsch [address] °[place] 336-554-5454   ° °Self-Help/Support Groups °Organization         Address  Phone             Notes  °[facility] - variety of support groups  336- 373-1402 Call for more information  °Narcotics Anonymous (NA), [facility] [address], °[place]  2 meetings at this location  ° °Residential Treatment Programs °Organization         Address  Phone  Notes  °[facility] [address],    °[place]  1-866-801-8205   °[facility] ° [address], [place], [place] 704-293-8524   °[facility] [address], [place] 336-845-3988 Admissions: 8am-3pm M-F  °[facility] [address]    °[place], [place] 336-841-1104   °[facility] [address], [place], [place] 336-379-7146   °[facility] [address]  °[place], [place] 336-285-9073   °[facility] - Intensive Outpatient [address], [place], [place] 336-852-3033   °[facility] [address]  °[place], [place] 1-877-615-2722 or 336-784-9470   °[facility] [address] [place], [place] 336-227-7417 Accepts Medicaid  °[facility] [address]  °[place] 1-800-659-3381 Substance Abuse/Addiction Treatment  ° °[place] Behavioral Health Resources °Organization          Address  Phone  Notes  °[facility]  (888) 581-9988   °Julie Brannon, PhD [address] [place], [place]   (336) 349-5553 or (336) 951-0000   °[facility]   [address] °[place], [place] (336) 349-4454   °[facility] [address], [place], [place] (336) 342-8316 Insurance/Medicaid/sponsorship through [facility]  °[facility] [address]                                    [place], [place] (336) 342-8316 Therapy/tele-psych/case  °[facility] [address]  ° [place], [place] (336) 349-2233    °Dr. Arfeen  (336) 349-4544   °[facility] [place]  [employer] [place] [facility] 1) [address], [place] °2) [address], [place] °3)  [address], [place] (336) 349-3220 °(336) 342-7768 ° °(336) 342-8140   °[place] [facility] (336) 342-1394 or (336) 342-3537 (After Hours)    ° ° °

## 2014-10-15 ENCOUNTER — Emergency Department (HOSPITAL_BASED_OUTPATIENT_CLINIC_OR_DEPARTMENT_OTHER): Payer: Self-pay

## 2014-10-15 ENCOUNTER — Emergency Department (HOSPITAL_BASED_OUTPATIENT_CLINIC_OR_DEPARTMENT_OTHER)
Admission: EM | Admit: 2014-10-15 | Discharge: 2014-10-15 | Disposition: A | Discharge status: Home / Self Care | Payer: Self-pay | Attending: Emergency Medicine | Admitting: Emergency Medicine

## 2014-10-15 ENCOUNTER — Other Ambulatory Visit: Payer: Self-pay | Admitting: Emergency Medicine

## 2014-10-15 ENCOUNTER — Encounter (HOSPITAL_BASED_OUTPATIENT_CLINIC_OR_DEPARTMENT_OTHER): Payer: Self-pay

## 2014-10-15 DIAGNOSIS — J40 Bronchitis, not specified as acute or chronic: Secondary | ICD-10-CM | POA: Insufficient documentation

## 2014-10-15 DIAGNOSIS — Z79899 Other long term (current) drug therapy: Secondary | ICD-10-CM | POA: Insufficient documentation

## 2014-10-15 DIAGNOSIS — Z7952 Long term (current) use of systemic steroids: Secondary | ICD-10-CM | POA: Insufficient documentation

## 2014-10-15 DIAGNOSIS — Z720 Tobacco use: Secondary | ICD-10-CM | POA: Insufficient documentation

## 2014-10-15 DIAGNOSIS — Z87442 Personal history of urinary calculi: Secondary | ICD-10-CM | POA: Insufficient documentation

## 2014-10-15 LAB — RAPID STREP SCREEN (MED CTR MEBANE ONLY): Streptococcus, Group A Screen (Direct): NEGATIVE

## 2014-10-15 MED ORDER — PREDNISONE 50 MG PO TABS
60.0000 mg | ORAL_TABLET | Freq: Once | ORAL | Status: AC
  Administered 2014-10-15: 60 mg via ORAL
  Filled 2014-10-15 (×2): qty 1

## 2014-10-15 MED ORDER — PROMETHAZINE-DM 6.25-15 MG/5ML PO SYRP: 5.0000 mL | ORAL_SOLUTION | Freq: Four times a day (QID) | ORAL | Status: DC | PRN

## 2014-10-15 MED ORDER — PREDNISONE 10 MG PO TABS: ORAL_TABLET | ORAL | Status: DC

## 2014-10-15 MED ORDER — ALBUTEROL SULFATE HFA 108 (90 BASE) MCG/ACT IN AERS
2.0000 | INHALATION_SPRAY | Freq: Once | RESPIRATORY_TRACT | Status: AC
  Administered 2014-10-15: 2 via RESPIRATORY_TRACT
  Filled 2014-10-15: qty 6.7

## 2014-10-15 NOTE — ED Notes (Addendum)
Pt reports about 1.5 weeks of cold symptoms.  Seen here about 1 week ago and place on ab for sinus infection, states sore throat worsening, feeling swelling in throat area, some sob, hoarse voice noted.  Reports when he started taking doxycycline, he also started having tingling to bilateral hands second, third and fourth digit knuckles.  States cough worsening.  R sided tonsillar swelling to uvula.

## 2014-10-15 NOTE — Discharge Instructions (Signed)
Take albuterol inhaler 2 puffs every 4 hours. Technical prednisone as prescribed until all gone. Take promethazine DM as prescribed for cough. Follow-up with your doctor for recheck next week.  Acute Bronchitis Bronchitis is inflammation of the airways that extend from the windpipe into the lungs (bronchi). The inflammation often causes mucus to develop. This leads to a cough, which is the most common symptom of bronchitis.  In acute bronchitis, the condition usually develops suddenly and goes away over time, usually in a couple weeks. Smoking, allergies, and asthma can make bronchitis worse. Repeated episodes of bronchitis may cause further lung problems.  CAUSES Acute bronchitis is most often caused by the same virus that causes a cold. The virus can spread from person to person (contagious) through coughing, sneezing, and touching contaminated objects. SIGNS AND SYMPTOMS   Cough.   Fever.   Coughing up mucus.   Body aches.   Chest congestion.   Chills.   Shortness of breath.   Sore throat.  DIAGNOSIS  Acute bronchitis is usually diagnosed through a physical exam. Your health care provider will also ask you questions about your medical history. Tests, such as chest X-rays, are sometimes done to rule out other conditions.  TREATMENT  Acute bronchitis usually goes away in a couple weeks. Oftentimes, no medical treatment is necessary. Medicines are sometimes given for relief of fever or cough. Antibiotic medicines are usually not needed but may be prescribed in certain situations. In some cases, an inhaler may be recommended to help reduce shortness of breath and control the cough. A cool mist vaporizer may also be used to help thin bronchial secretions and make it easier to clear the chest.  HOME CARE INSTRUCTIONS  Get plenty of rest.   Drink enough fluids to keep your urine clear or pale yellow (unless you have a medical condition that requires fluid restriction). Increasing  fluids may help thin your respiratory secretions (sputum) and reduce chest congestion, and it will prevent dehydration.   Take medicines only as directed by your health care provider.  If you were prescribed an antibiotic medicine, finish it all even if you start to feel better.  Avoid smoking and secondhand smoke. Exposure to cigarette smoke or irritating chemicals will make bronchitis worse. If you are a smoker, consider using nicotine gum or skin patches to help control withdrawal symptoms. Quitting smoking will help your lungs heal faster.   Reduce the chances of another bout of acute bronchitis by washing your hands frequently, avoiding people with cold symptoms, and trying not to touch your hands to your mouth, nose, or eyes.   Keep all follow-up visits as directed by your health care provider.  SEEK MEDICAL CARE IF: Your symptoms do not improve after 1 week of treatment.  SEEK IMMEDIATE MEDICAL CARE IF:  You develop an increased fever or chills.   You have chest pain.   You have severe shortness of breath.  You have bloody sputum.   You develop dehydration.  You faint or repeatedly feel like you are going to pass out.  You develop repeated vomiting.  You develop a severe headache. MAKE SURE YOU:   Understand these instructions.  Will watch your condition.  Will get help right away if you are not doing well or get worse. Document Released: 08/29/2004 Document Revised: 12/06/2013 Document Reviewed: 01/12/2013 [facility] Patient Information 2015 [employer] [place] [place]. This information is not intended to replace advice given to you by your health care provider. Make sure you discuss any questions  you have with your health care provider.

## 2014-10-15 NOTE — ED Provider Notes (Signed)
CSN: [id number]     Arrival date & time 10/15/14  1739 History   First MD Initiated Contact with Patient 10/15/14 1819     Chief Complaint  Patient presents with  . Sore Throat     (Consider location/radiation/quality/duration/timing/severity/associated sxs/prior Treatment) HPI Ross Best is a 29 y.o. male with hx of kindney stones, presents to ED complaining of sore throat and cough. Pt states he has had symptoms for over a week. He was seen here week ago, was placed on doxycycline. He states he finished the whole course, all of his symptoms improved instantly except for his cough and sore throat. He states the sore throat is most likely from coughing. He reports cough is constant, nonproductive, worse at night. He states he cannot sleep. He has been taking Robitussin which is not helping. He states he quit smoking as well. Patient is also complaining of some tingling to bilateral dorsal knuckles. He states he thinks it could be from nicotine withdrawal but is unsure. He denies any injuries to those areas.  Past Medical History  Diagnosis Date  . Kidney stone    Past Surgical History  Procedure Laterality Date  . Foot tumor excision     No family history on file. History  Substance Use Topics  . Smoking status: Current Every Day Smoker -- 1.00 packs/day    Types: Cigarettes  . Smokeless tobacco: Not on file  . Alcohol Use: Yes     Comment: occ    Review of Systems  Constitutional: Negative for fever and chills.  HENT: Positive for sore throat and voice change. Negative for congestion.   Respiratory: Positive for cough and wheezing. Negative for chest tightness and shortness of breath.   Cardiovascular: Negative for chest pain, palpitations and leg swelling.  Gastrointestinal: Negative for nausea, vomiting, abdominal pain, diarrhea and abdominal distention.  Genitourinary: Negative for dysuria, urgency, frequency and hematuria.  Musculoskeletal: Negative for myalgias,  arthralgias, neck pain and neck stiffness.  Skin: Negative for rash.  Allergic/Immunologic: Negative for immunocompromised state.  Neurological: Positive for numbness. Negative for dizziness, weakness, light-headedness and headaches.      Allergies  Review of patient's allergies indicates no known allergies.  Home Medications   Prior to Admission medications   Medication Sig Start Date End Date Taking? Authorizing Provider  albuterol (PROVENTIL HFA;VENTOLIN HFA) 108 (90 BASE) MCG/ACT inhaler Inhale 2 puffs into the lungs every 4 (four) hours as needed (for cough). 01/17/14   Adrian BlackwaterZachary H Baker, PA-C  clindamycin (CLEOCIN) 150 MG capsule Take by mouth 3 (three) times daily.    Historical Provider, MD  doxycycline (VIBRAMYCIN) 100 MG capsule Take 1 capsule (100 mg total) by mouth 2 (two) times daily. One po bid x 7 days 10/08/14   Ladona MowJoe Mintz, PA-C  guaiFENesin-codeine 100-10 MG/5ML syrup Take 5-10 mLs by mouth every 4 (four) hours as needed for cough. 01/17/14   Graylon GoodZachary H Baker, PA-C  HYDROcodone-acetaminophen (NORCO) 5-325 MG per tablet Take 1-2 tablets by mouth every 4 (four) hours as needed. 02/14/14   Elpidio AnisShari Upstill, PA-C  predniSONE (DELTASONE) 10 MG tablet Take 2 tablets (20 mg total) by mouth 2 (two) times daily. 11/08/13   Geoffery Lyonsouglas Delo, MD  predniSONE (DELTASONE) 10 MG tablet 4 tabs PO QD for 4 days; 3 tabs PO QD for 3 days; 2 tabs PO QD for 2 days; 1 tab PO QD for 1 day 01/17/14   Graylon GoodZachary H Baker, PA-C  Spacer/Aero-Holding Chambers (AEROCHAMBER PLUS FLO-VU LARGE) MISC 1  each by Other route once. 01/17/14   Adrian Blackwater Baker, PA-C   BP 124/80 mmHg  Pulse 89  Temp(Src) 98.4 F (36.9 C) (Oral)  Resp 20  Ht  (1.854 m)  Wt 260 lb (117.935 kg)  BMI 34.31 kg/m2  SpO2 97% Physical Exam  Constitutional: He is oriented to person, place, and time. He appears well-developed and well-nourished. No distress.  Voice is horse  HENT:  Head: Normocephalic and atraumatic.  Right Ear: External ear  normal.  Left Ear: External ear normal.  Nose: Nose normal.  Oropharynx and bilateral tonsils erythematous. No exudate. Uvula is midline  Eyes: Conjunctivae are normal.  Neck: Neck supple.  Cardiovascular: Normal rate, regular rhythm and normal heart sounds.   Pulmonary/Chest: Effort normal. No respiratory distress. He has wheezes. He has no rales.  Mild end expiratory wheezes bilaterally  Abdominal: Soft. Bowel sounds are normal. He exhibits no distension. There is no tenderness. There is no rebound.  Musculoskeletal: He exhibits no edema.  Neurological: He is alert and oriented to person, place, and time.  Skin: Skin is warm and dry.  Nursing note and vitals reviewed.   ED Course  Procedures (including critical care time) Labs Review Labs Reviewed  RAPID STREP SCREEN    Imaging Review Dg Chest 2 View  10/15/2014   CLINICAL DATA:  Approximate 10 day history of cough, chest congestion, sore throat. Tingling in both hands which began earlier today. Current smoker.  EXAM: CHEST  2 VIEW  COMPARISON:  04/07/2012 dating back to 01/05/2004.  FINDINGS: Cardiomediastinal silhouette unremarkable, unchanged. Lungs clear. Bronchovascular markings normal. Pulmonary vascularity normal. No visible pleural effusions. No pneumothorax. Visualized bony thorax intact. No significant interval change.  IMPRESSION: No acute cardiopulmonary disease.  Stable examination.   Electronically Signed   By: Hulan Saas M.D.   On: 10/15/2014 18:35     EKG Interpretation None      MDM   Final diagnoses:  Bronchitis    Patient will call for a week and a half. Initially URI symptoms which improved with doxycycline. States currently only having cough, unable to sleep at night because of the cough, sore throat from coughing. Strep already obtained here by triage nurse and is negative. Chest x-ray also placed by triage nurse and is negative. Patient has mild expiratory wheezes on exam. Most likely viral  bronchitis at this point. Will treat with prednisone and inhaler. Follow-up with primary care doctor. His vital signs are normal. He is otherwise nontoxic appearing. He is otherwise healthy.  Filed Vitals:   10/15/14 1745  BP: 124/80  Pulse: 89  Temp: 98.4 F (36.9 C)  TempSrc: Oral  Resp: 20  Height:  (1.854 m)  Weight: 260 lb (117.935 kg)  SpO2: 97%       Jaynie Crumble, PA-C 10/15/14 2353  Arby Barrette, MD 10/21/14 [phone]

## 2014-10-17 LAB — CULTURE, GROUP A STREP: STREP A CULTURE: NEGATIVE

## 2015-08-11 DIAGNOSIS — F17210 Nicotine dependence, cigarettes, uncomplicated: Secondary | ICD-10-CM | POA: Insufficient documentation

## 2015-11-24 ENCOUNTER — Ambulatory Visit (INDEPENDENT_AMBULATORY_CARE_PROVIDER_SITE_OTHER): Payer: Self-pay

## 2015-11-24 ENCOUNTER — Ambulatory Visit (INDEPENDENT_AMBULATORY_CARE_PROVIDER_SITE_OTHER): Payer: Self-pay | Admitting: Family Medicine

## 2015-11-24 ENCOUNTER — Encounter: Payer: Self-pay | Admitting: Family Medicine

## 2015-11-24 VITALS — BP 159/92 | HR 73 | Ht 73.0 in | Wt 268.0 lb

## 2015-11-24 DIAGNOSIS — M25532 Pain in left wrist: Secondary | ICD-10-CM

## 2015-11-24 MED ORDER — PREDNISONE 10 MG (48) PO TBPK: ORAL_TABLET | Freq: Every day | ORAL | Status: DC

## 2015-11-24 NOTE — Patient Instructions (Signed)
Thank you for coming in today. Use the brace.  Take the prednisone.  Do the exercises.  Return in 4 weeks or so.   Extensor Carpi Ulnaris Tendinitis With Rehab Tendonitis involves inflammation of a tendon, which is a soft tissue that connects muscle to bone. The Extensor Carpi Ulnaris (ECU) tendon is vulnerable to tendonitis. The ECU is responsible for straightening and rotating (abducting) the wrist. The ECU is important for gripping and pulling. ECU tendonitis may include inflammation of the ECU tendon lining (sheath). The tendon sheath usually secretes a fluid that allows the tendon to function smoothly, but when it becomes inflamed, function is impaired. This condition may also include a tear in the ECU tendon or muscle (strain). The strain may be classified as a grade 1 or 2 strain. Grade 1 strains cause pain, but the tendon is not lengthened. Grade 2 strains include a lengthened ligament, due to being stretched or partially ruptured. With grade 2 strains there is still function, although the function may be reduced. SYMPTOMS   Pain, tenderness, swelling, warmth, or redness on the little finger side of the wrist.  Pain that worsens when straightening the wrist or bending it toward the little finger.  Pain with gripping.  Limited motion of the wrist.  Crackling sound (crepitation) when the tendon or wrist is moved or touched. CAUSES  ECU tendonitis is caused by injury to the ECU tendon. It is usually due to chronic or repetitive injuries, but may be due to sharp (acute) injury. Common causes of injury include:  Strain from unusual use, overuse, or increase in activity, or change in activity of the wrist, hand, or forearm.  Direct hit (trauma) to the muscles and tendon on the side of the wrist.  Repetitive motions of the hand and wrist, due to friction of the tendon within the tendon lining. RISK INCREASES WITH:  Sports that involve repetitive hand and wrist motions (i.e. golfing,  bowling).  Sports that require gripping (i.e. tennis, golf, weightlifting).  Heavy labor.  Poor wrist and forearm strength and flexibility.  Failure to warm-up properly before activity. PREVENTION   Warm up and stretch properly before activity.  Allow the body to recover between activities.  Maintain physical fitness:  Strength, flexibility, and endurance.  Cardiovascular fitness.  Learn and use proper exercise technique. PROGNOSIS  If treated properly, ECU tendonitis is usually curable within 6 weeks.  RELATED COMPLICATIONS   Longer healing time, if not properly treated or if not given adequate time to heal.  Chronically inflamed tendon, causing persistent pain with activity. This may progress to constant pain, restriction of motion, and potentially tearing of the tendon.  Recurring symptoms, especially if activity is resumed too soon.  Risks of surgery: infection, bleeding, injury to nerves, continued pain, incomplete release of the tendon lining, recurring symptoms, cutting of the tendon, and weakness of the wrist and grip. TREATMENT  Treatment initially involves the use of ice and medicine, to help reduce pain and inflammation. Performing stretching and strengthening exercises regularly is important for a quick recovery. These exercises may be completed at home or with a therapist. Your caregiver may recommend the use of a brace or splint to reduce motions that aggravate symptoms. Corticosteroid injections may be recommended. If non-surgical treatment is unsuccessful, then surgery may be needed.  MEDICATION   If pain medicine is needed, nonsteroidal anti-inflammatory medicines (aspirin and ibuprofen), or other minor pain relievers (acetaminophen), are often recommended.  Do not take pain medicine for 7 days before  surgery.  Prescription pain relievers may be given if your caregiver thinks they are needed. Use only as directed and only as much as you  need.  Corticosteroid injections may be given to reduce inflammation. However, these injections should be reserved for serious cases, as they may only be given a certain number of times. COLD THERAPY  Cold treatment (icing) relieves pain and reduces inflammation. Cold treatment should be applied for 10 to 15 minutes every 2 to 3 hours, and immediately after activity that aggravates your symptoms. Use ice packs or an ice massage. SEEK MEDICAL CARE IF:   Symptoms get worse or do not improve in 2 weeks, despite treatment.  You experience pain, numbness, or coldness in the hand.  Blue, gray, or dark color appears in the fingernails.  Any of the following occur after surgery: increased pain, swelling, redness, drainage of fluids, bleeding in the surgical area, or signs of infection.  New, unexplained symptoms develop. (Drugs used in treatment may produce side effects.) EXERCISES RANGE OF MOTION (ROM) AND STRETCHING EXERCISES - Extensor Carpi Ulnaris Tendinitis These are some of the initial exercises you may start your recovery program with, until you see your caregiver again or until your symptoms are resolved. Remember:  Flexible tissue is more tolerant of the stresses placed on it during activity.  Each stretch should be held for 20 to 30 seconds.  A gentle stretching sensation should be felt. RANGE OF MOTION - Wrist Flexion  Hold your right / left wrist with the fingers pointing down toward the floor.  Pull down on the wrist until you feel a stretch.  Hold this position for __________ seconds. Repeat exercise __________ times, __________ times per day.  This exercise should be done with the elbow: _____ Bent to 90 degrees. _____ Straight. RANGE OF MOTION - Wrist Flexion  Place the back of your right / left hand flat on the top of a table. Your shoulder should be turned in and your fingers facing away from your body.  Press down, bending your wrist and straightening your elbow  until your feel a stretch.  Hold this position for __________ seconds.  Repeat exercise __________ times, __________ times per day. STRENGTHENING EXERCISES - Extensor Carpi Ulnaris Tendinitis These are some of the initial exercises you may start your recovery program with, until you see your caregiver again or until your symptoms are resolved. Remember:  Strong muscles with good endurance tolerate stress better.  Do the exercises as initially prescribed by your caregiver. Progress slowly with each exercise, gradually increasing the number of repetitions and weight used, only as guided. RANGE OF MOTION - Wrist Extensors  Sit or stand with your forearm supported.  Using a __________ weight or a piece of rubber band or tubing, bend your wrist slowly upward toward you.  Hold this position for __________ seconds and then slowly lower the wrist back to the starting position.  Repeat exercise __________ times, __________ times per day. RANGE OF MOTION - Wrist, Ulnar Deviation  Stand with a hammer in your hand, or sit while holding onto a rubber band or tubing with both hands, and with your arm supported on a table.  Raise your hand with the hammer upward behind you, or pull down on the rubber tubing.  Hold this position for __________ seconds and then slowly lower the wrist back to the starting position.  Repeat exercise __________ times, __________ times per day. STRENGTH - Grip  Hold a wad of putty, soft modeling clay,  a large sponge, a soft rubber ball or a soft tennis ball in your hand.  Squeeze as hard as you can.  Hold this position for __________ seconds.  Repeat exercise __________ times, __________ times per day.   This information is not intended to replace advice given to you by your health care provider. Make sure you discuss any questions you have with your health care provider.   Document Released: 07/22/2005 Document Revised: 08/12/2014 Document Reviewed:  11/03/2008 Elsevier Interactive Patient Education [employer]

## 2015-11-24 NOTE — Progress Notes (Signed)
Ross GunningRandall C Best is a 30 y.o. male who [facility] for left wrist pain. Patient notes a several year history of intermittent ulnar-sided wrist pain off and on. He was doing well until recently. A few days ago he developed intense ulnar wrist pain. Pain is worse with wrist extension flexion and ulnar and radial deviation. He is left-hand dominant and works as a Glass blower/designerstone worker for [employer]. This pain is limiting his ability to work. He has tried ibuprofen leftover tramadol and a wrist brace which have not helped much.  Additionally he notes a mild tingly sensation to the dorsal side of his left thumb. This is mild. He denies any numbness. He has never had this before. He denies any injury.   Past Medical History  Diagnosis Date  . Kidney stone    Past Surgical History  Procedure Laterality Date  . Foot tumor excision     Social History  Substance Use Topics  . Smoking status: Current Every Day Smoker -- 1.00 packs/day    Types: Cigarettes  . Smokeless tobacco: Not on file  . Alcohol Use: Yes     Comment: occ   family history is not on file.  ROS:  No headache, visual changes, nausea, vomiting, diarrhea, constipation, dizziness, abdominal pain, skin rash, fevers, chills, night sweats, weight loss, swollen lymph nodes, body aches, joint swelling, muscle aches, chest pain, shortness of breath, mood changes, visual or auditory hallucinations.    Medications: Current Outpatient Prescriptions  Medication Sig Dispense Refill  . albuterol (PROVENTIL HFA;VENTOLIN HFA) 108 (90 BASE) MCG/ACT inhaler Inhale 2 puffs into the lungs every 4 (four) hours as needed (for cough). 8.5 g 1  . predniSONE (STERAPRED UNI-PAK 48 TAB) 10 MG (48) TBPK tablet Take by mouth daily. 12 day dosepack po 48 tablet 0  . Spacer/Aero-Holding Chambers (AEROCHAMBER PLUS FLO-VU LARGE) MISC 1 each by Other route once. 1 each 0   No current  facility-administered medications for this visit.   No Known Allergies   Exam:  BP 159/92 mmHg  Pulse 73  Ht 6\' 1"  (1.854 m)  Wt 268 lb (121.564 kg)  BMI 35.37 kg/m2 General: Well Developed, well nourished, and in no acute distress.  Neuro/Psych: Alert and oriented x3, extra-ocular muscles intact, able to move all 4 extremities, sensation grossly intact. Skin: Warm and dry, no rashes noted.  Respiratory: Not using accessory muscles, speaking in full sentences, trachea midline.  Cardiovascular: Pulses palpable, no extremity edema. Abdomen: Does not appear distended. MSK: Neck nontender normal motion Elbow nontender normal motion. Tingly sensation to the left thumb is worse with pressure overlying the lateral forearm. Wrist is normal appearing. Mildly tender to palpation overlying the ulnar wrist. Normal wrist motion. Pain with resisted wrist extension and ulnar deviation and passive stretch flexion and radial deviation of the wrist. No palpable click. Negative Tinel's test overlying the carpal tunnel    Limited musculoskeletal ultrasound of the left wrist: Normal bony structures. Hypoechoic fluid surrounding the extensor carpi ulnaris no the musculotendinous junction. This is the area of maximal tenderness. No tendon tears or visible.   No results found for this or any previous visit (from the past 24 hour(s)). Dg Wrist Complete Left  11/24/2015  CLINICAL DATA:  Left wrist pain for 1 week.  No injury EXAM: LEFT WRIST - COMPLETE 3+ VIEW COMPARISON:  08/30/2007 FINDINGS: There is no evidence of fracture or dislocation. There is no evidence of arthropathy or  other focal bone abnormality. Soft tissues are unremarkable. IMPRESSION: Negative. Electronically Signed   By: Charlett Nose M.D.   On: 11/24/2015 74:22    30 year old male with left wrist extensor carpi ulnaris tendinitis. Plan for ulnar wrist splint and trial of oral prednisone. Additionally proceed with home physical therapy take  exercises with eccentric exercises. Recheck in about 4 weeks.

## 2015-11-29 ENCOUNTER — Ambulatory Visit (INDEPENDENT_AMBULATORY_CARE_PROVIDER_SITE_OTHER): Payer: Self-pay | Admitting: Family Medicine

## 2015-11-29 ENCOUNTER — Encounter: Payer: Self-pay | Admitting: Family Medicine

## 2015-11-29 VITALS — BP 154/93 | HR 82 | Wt 272.0 lb

## 2015-11-29 DIAGNOSIS — R202 Paresthesia of skin: Secondary | ICD-10-CM

## 2015-11-29 MED ORDER — AMITRIPTYLINE HCL 25 MG PO TABS: 25.0000 mg | ORAL_TABLET | Freq: Every evening | ORAL | Status: DC | PRN

## 2015-11-29 NOTE — Assessment & Plan Note (Signed)
Likely due to local nerve irritation in the dorsal radial wrist. Plan for decreasing compression by removing the wrist brace as well as continue prednisone. Initially treated with amitriptyline and rest. Return if needed.

## 2015-11-29 NOTE — Progress Notes (Signed)
       Ross GunningRandall C Best is a 30 y.o. male who [facility] today for follow-up left wrist pain. Patient was seen recently for left wrist pain thought to be due to ECU tendinitis. He was treated with ulnar gutter splint and prednisone Dosepak. He notes significant improvement and ulnar sided wrist pain. However initially he also had some numbness and tingling to the dorsal aspect of his thumb and second digit. This has continued and slightly worsened. He denies any weakness or loss of function. He feels well otherwise.   Past Medical History  Diagnosis Date  . Kidney stone    Past Surgical History  Procedure Laterality Date  . Foot tumor excision     Social History  Substance Use Topics  . Smoking status: Current Every Day Smoker -- 1.00 packs/day    Types: Cigarettes  . Smokeless tobacco: Not on file  . Alcohol Use: Yes     Comment: occ   family history is not on file.  ROS as above Medications: Current Outpatient Prescriptions  Medication Sig Dispense Refill  . albuterol (PROVENTIL HFA;VENTOLIN HFA) 108 (90 BASE) MCG/ACT inhaler Inhale 2 puffs into the lungs every 4 (four) hours as needed (for cough). 8.5 g 1  . predniSONE (STERAPRED UNI-PAK 48 TAB) 10 MG (48) TBPK tablet Take by mouth daily. 12 day dosepack po 48 tablet 0  . Spacer/Aero-Holding Chambers (AEROCHAMBER PLUS FLO-VU LARGE) MISC 1 each by Other route once. 1 each 0  . amitriptyline (ELAVIL) 25 MG tablet Take 1 tablet (25 mg total) by mouth at bedtime as needed (hand tingling). 30 tablet 1   No current facility-administered medications for this visit.   No Known Allergies   Exam:  BP 154/93 mmHg  Pulse 82  Wt 272 lb (123.378 kg) Gen: Well NAD Left hand normal-appearing nontender. Normal wrist motion. Strength is intact all modalities. Sensation is intact. Patient expresses paresthesias it worsened with  palpation overlying the radial styloid area. He has negative Tinel's overlying the carpal tunnel, cubital tunnel, and pressure overlying the forearm and upper arm.   No results found for this or any previous visit (from the past 24 hour(s)). No results found.   Please see individual assessment and plan sections.

## 2015-11-29 NOTE — Patient Instructions (Addendum)
Thank you for coming in today. Return as needed.  Use amitriptyline at night for numbness and tingling.  Try to avoid over doing it.  This should slowly improve.   Paresthesia Paresthesia is an abnormal burning or prickling sensation. This sensation is generally felt in the hands, arms, legs, or feet. However, it may occur in any part of the body. Usually, it is not painful. The feeling may be described as:  Tingling or numbness.  Pins and needles.  Skin crawling.  Buzzing.  Limbs falling asleep.  Itching. Most people experience temporary (transient) paresthesia at some time in their lives. Paresthesia may occur when you breathe too quickly (hyperventilation). It can also occur without any apparent cause. Commonly, paresthesia occurs when pressure is placed on a nerve. The sensation quickly goes away after the pressure is removed. For some people, however, paresthesia is a long-lasting (chronic) condition that is caused by an underlying disorder. If you continue to have paresthesia, you may need further medical evaluation. HOME CARE INSTRUCTIONS Watch your condition for any changes. Taking the following actions may help to lessen any discomfort that you are feeling:  Avoid drinking alcohol.  Try acupuncture or massage to help relieve your symptoms.  Keep all follow-up visits as directed by your health care provider. This is important. SEEK MEDICAL CARE IF:  You continue to have episodes of paresthesia.  Your burning or prickling feeling gets worse when you walk.  You have pain, cramps, or dizziness.  You develop a rash. SEEK IMMEDIATE MEDICAL CARE IF:  You feel weak.  You have trouble walking or moving.  You have problems with speech, understanding, or vision.  You feel confused.  You cannot control your bladder or bowel movements.  You have numbness after an injury.  You faint.   This information is not intended to replace advice given to you by your health care  provider. Make sure you discuss any questions you have with your health care provider.   Document Released: 07/12/2002 Document Revised: 12/06/2014 Document Reviewed: 07/18/2014 Elsevier Interactive Patient Education [employer]2016 Elsevier Inc.

## 2015-12-01 ENCOUNTER — Telehealth: Payer: Self-pay

## 2015-12-01 NOTE — Telephone Encounter (Signed)
Pt called stating that he has been experiencing abdominal pain since starting prednisone. He did not take it today and does not feel the abdominal pain but does feel what he describes as withdrawal symptoms. Advised pt to taper off the prednisone. He would like to know if there is any changes to his plan due to this med problem. Please advise.

## 2015-12-04 MED ORDER — METHYLPREDNISOLONE 4 MG PO TBPK: ORAL_TABLET | ORAL | Status: DC

## 2015-12-04 NOTE — Telephone Encounter (Signed)
Pt stopped prednisone and is not longer experiencing abdominal pain. Advised that he is still having feeling tingling in his fingers. He would like to try an alternate medication. Please advise

## 2015-12-04 NOTE — Telephone Encounter (Signed)
We will try a medrol dosepack.  Also take it with pepcid or prilosec over the counter to protect the stomach from ulcers.

## 2015-12-04 NOTE — Telephone Encounter (Signed)
I think it makes sense to taper off. We can retry a similar medicine if hand symptoms continue. Please advise pt.

## 2015-12-05 ENCOUNTER — Other Ambulatory Visit: Payer: Self-pay

## 2015-12-05 MED ORDER — METHYLPREDNISOLONE 4 MG PO TBPK: ORAL_TABLET | ORAL | Status: DC

## 2015-12-05 NOTE — Telephone Encounter (Signed)
Pt notified. rx sent to alternate pharmacy per pt request.

## 2016-01-17 ENCOUNTER — Ambulatory Visit (INDEPENDENT_AMBULATORY_CARE_PROVIDER_SITE_OTHER): Payer: Self-pay | Admitting: Family Medicine

## 2016-01-17 DIAGNOSIS — Z5329 Procedure and treatment not carried out because of patient's decision for other reasons: Secondary | ICD-10-CM

## 2016-01-18 NOTE — Progress Notes (Signed)
No show. F/u PRN 

## 2016-02-01 ENCOUNTER — Encounter (HOSPITAL_BASED_OUTPATIENT_CLINIC_OR_DEPARTMENT_OTHER): Payer: Self-pay | Admitting: *Deleted

## 2016-02-01 ENCOUNTER — Emergency Department (HOSPITAL_BASED_OUTPATIENT_CLINIC_OR_DEPARTMENT_OTHER): Payer: Self-pay

## 2016-02-01 ENCOUNTER — Emergency Department (HOSPITAL_BASED_OUTPATIENT_CLINIC_OR_DEPARTMENT_OTHER)
Admission: EM | Admit: 2016-02-01 | Discharge: 2016-02-01 | Disposition: A | Discharge status: Home / Self Care | Payer: Self-pay | Attending: Emergency Medicine | Admitting: Emergency Medicine

## 2016-02-01 DIAGNOSIS — Y999 Unspecified external cause status: Secondary | ICD-10-CM | POA: Insufficient documentation

## 2016-02-01 DIAGNOSIS — R030 Elevated blood-pressure reading, without diagnosis of hypertension: Secondary | ICD-10-CM | POA: Insufficient documentation

## 2016-02-01 DIAGNOSIS — S39011A Strain of muscle, fascia and tendon of abdomen, initial encounter: Secondary | ICD-10-CM | POA: Insufficient documentation

## 2016-02-01 DIAGNOSIS — Y939 Activity, unspecified: Secondary | ICD-10-CM | POA: Insufficient documentation

## 2016-02-01 DIAGNOSIS — Y929 Unspecified place or not applicable: Secondary | ICD-10-CM | POA: Insufficient documentation

## 2016-02-01 DIAGNOSIS — F17210 Nicotine dependence, cigarettes, uncomplicated: Secondary | ICD-10-CM | POA: Insufficient documentation

## 2016-02-01 DIAGNOSIS — X58XXXA Exposure to other specified factors, initial encounter: Secondary | ICD-10-CM | POA: Insufficient documentation

## 2016-02-01 DIAGNOSIS — J209 Acute bronchitis, unspecified: Secondary | ICD-10-CM | POA: Insufficient documentation

## 2016-02-01 MED ORDER — ALBUTEROL SULFATE HFA 108 (90 BASE) MCG/ACT IN AERS: 2.0000 | INHALATION_SPRAY | RESPIRATORY_TRACT | Status: DC | PRN

## 2016-02-01 MED ORDER — BENZONATATE 100 MG PO CAPS: 100.0000 mg | ORAL_CAPSULE | Freq: Three times a day (TID) | ORAL | Status: DC

## 2016-02-01 MED ORDER — AEROCHAMBER PLUS W/MASK MISC: Status: DC

## 2016-02-01 MED FILL — PROVENTIL HFA 90 MCG INH: 108 (90 BAS | 30 days supply | Qty: 7 | Fill #0

## 2016-02-01 MED FILL — BENZONATATE 100 MG CAPSULE: 100 | 7 days supply | Qty: 21 | Fill #0

## 2016-02-01 NOTE — Discharge Instructions (Signed)
Acute Bronchitis Use your inhaler 2 puffs every 4 hours as needed for cough or shortness of breath. You can take the cough medicine as prescribed as well. Take ibuprofen or Tylenol for pain. Standing of the warm shower will help as well. [facility] to get a primary care physician. Ask your new doctor to help you to stop smoking. Get your blood pressure rechecked within the next 3 weeks. Today's was elevated at 139/96. Bronchitis is when the airways that extend from the windpipe into the lungs get red, puffy, and painful (inflamed). Bronchitis often causes thick spit (mucus) to develop. This leads to a cough. A cough is the most common symptom of bronchitis. In acute bronchitis, the condition usually begins suddenly and goes away over time (usually in 2 weeks). Smoking, allergies, and asthma can make bronchitis worse. Repeated episodes of bronchitis may cause more lung problems. HOME CARE  Rest.  Drink enough fluids to keep your pee (urine) clear or pale yellow (unless you need to limit fluids as told by your doctor).  Only take over-the-counter or prescription medicines as told by your doctor.  Avoid smoking and secondhand smoke. These can make bronchitis worse. If you are a smoker, think about using nicotine gum or skin patches. Quitting smoking will help your lungs heal faster.  Reduce the chance of getting bronchitis again by:  Washing your hands often.  Avoiding people with cold symptoms.  Trying not to touch your hands to your mouth, nose, or eyes.  Follow up with your doctor as told. GET HELP IF: Your symptoms do not improve after 1 week of treatment. Symptoms include:  Cough.  Fever.  Coughing up thick spit.  Body aches.  Chest congestion.  Chills.  Shortness of breath.  Sore throat. GET HELP RIGHT AWAY IF:   You have an increased fever.  You have chills.  You have severe shortness of breath.  You have bloody thick spit  (sputum).  You throw up (vomit) often.  You lose too much body fluid (dehydration).  You have a severe headache.  You faint. MAKE SURE YOU:   Understand these instructions.  Will watch your condition.  Will get help right away if you are not doing well or get worse.   This information is not intended to replace advice given to you by your health care provider. Make sure you discuss any questions you have with your health care provider.   Document Released: 01/08/2008 Document Revised: 03/24/2013 Document Reviewed: 01/12/2013 Elsevier Interactive Patient Education [employer] Elsevier Inc.

## 2016-02-01 NOTE — ED Notes (Signed)
Left rib pain after coughing x 4 days. MD at bedside evaluating pt during triage

## 2016-02-01 NOTE — ED Provider Notes (Signed)
CSN: [id number]     Arrival date & time 02/01/16  [id number] History   First MD Initiated Contact with [id number] 02/01/16 [phone]     No chief complaint on file.    (Consider location/radiation/quality/duration/timing/severity/associated sxs/prior Treatment) Patient is a 30 y.o. male presenting with cough.  Cough  Presents with left flank only with coughing or changing positions onset approximately a week ago. He has pain only when he coughs or abducts his left shoulder. No pain when he remains still Denies shortness of breath denies fever. He treated himself with one of his mother's muscle relaxer tablets and 800 ibuprofen last night without relief. No other associated symptoms. Past Medical History  Diagnosis Date  . Kidney stone    Past Surgical History  Procedure Laterality Date  . Foot tumor excision     No family history on file. Social History  Substance Use Topics  . Smoking status: Current Every Day Smoker -- 1.00 packs/day    Types: Cigarettes  . Smokeless tobacco: Not on file  . Alcohol Use: Yes     Comment: occ    Review of Systems  Constitutional: Negative.   HENT: Negative.   Respiratory: Positive for cough.   Cardiovascular: Negative.   Gastrointestinal: Negative.   Genitourinary: Positive for flank pain.  Musculoskeletal: Negative.   Skin: Negative.   Neurological: Negative.   Psychiatric/Behavioral: Negative.   All other systems reviewed and are negative.     Allergies  Review of patient's allergies indicates no known allergies.  Home Medications   Prior to Admission medications   Medication Sig Start Date End Date Taking? Authorizing Provider  albuterol (PROVENTIL HFA;VENTOLIN HFA) 108 (90 BASE) MCG/ACT inhaler Inhale 2 puffs into the lungs every 4 (four) hours as needed (for cough). 01/17/14   Graylon GoodZachary H Baker, PA-C  amitriptyline (ELAVIL) 25 MG tablet Take 1 tablet (25 mg total) by mouth at bedtime as needed (hand tingling). 11/29/15   Rodolph BongEvan S Corey, MD   methylPREDNISolone (MEDROL DOSEPAK) 4 MG TBPK tablet dosepack po 12/05/15   Rodolph BongEvan S Corey, MD  predniSONE (STERAPRED UNI-PAK 48 TAB) 10 MG (48) TBPK tablet Take by mouth daily. 12 day dosepack po 11/24/15   Rodolph BongEvan S Corey, MD  Spacer/Aero-Holding Chambers (AEROCHAMBER PLUS FLO-VU LARGE) MISC 1 each by Other route once. 01/17/14   Adrian BlackwaterZachary H Baker, PA-C   BP 146/100 mmHg  Pulse 82  Temp(Src) 98.6 F (37 C) (Oral)  Resp 18  Ht 6\' 1"  (1.854 m)  Wt 260 lb (117.935 kg)  BMI 34.31 kg/m2  SpO2 98% Physical Exam  Constitutional: He appears well-developed and well-nourished. No distress.  HENT:  Head: Normocephalic and atraumatic.  Eyes: Conjunctivae are normal. Pupils are equal, round, and reactive to light.  Neck: Neck supple. No tracheal deviation present. No thyromegaly present.  Cardiovascular: Normal rate and regular rhythm.   No murmur heard. Pulmonary/Chest: Effort normal and breath sounds normal.  Abdominal: Soft. Bowel sounds are normal. He exhibits no distension. There is no tenderness.  Musculoskeletal: Normal range of motion. He exhibits no edema or tenderness.  Neurological: He is alert. Coordination normal.  Skin: Skin is warm and dry. No rash noted.  Psychiatric: He has a normal mood and affect.  Nursing note and vitals reviewed.   ED Course  Procedures (including critical care time) Labs Review Labs Reviewed - No data to display  Imaging Review No results found. I have personally reviewed and evaluated these images and lab results as part of my medical  decision-making.   EKG Interpretation None     Chest x-ray viewed by me 9:20 AM patient no distress Results for orders placed or performed during the hospital encounter of 10/15/14  Rapid strep screen  Result Value Ref Range   Streptococcus, Group A Screen (Direct) NEGATIVE NEGATIVE   Dg Chest 2 View  02/01/2016  CLINICAL DATA:  Cough and chest congestion for the past 4 days. Left axillary rib pain. Smoker. EXAM:  CHEST  2 VIEW COMPARISON:  10/15/2014. FINDINGS: The heart size and mediastinal contours are within normal limits. Both lungs are clear. The visualized skeletal structures are unremarkable. IMPRESSION: Normal examination. Electronically Signed   By: Beckie SaltsSteven  Reid M.D.   On: 02/01/2016 09:01    MDM  Plan prescription Tessalon Perles, albuterol HFA with spacer to go. Blood pressure recheck 3 weeks I counseled patient for 5 minutes on smoking cessation. Referral [facility] Diagnoses #1 acute bronchitis #2 muscle strain #3 tobacco abuse #4 elevated blood pressure Final diagnoses:  None        Doug SouSam Graylyn Bunney, MD 02/01/16 [phone]

## 2017-07-21 DIAGNOSIS — R002 Palpitations: Secondary | ICD-10-CM | POA: Insufficient documentation

## 2017-07-21 DIAGNOSIS — M2550 Pain in unspecified joint: Secondary | ICD-10-CM | POA: Insufficient documentation

## 2017-07-21 DIAGNOSIS — Z720 Tobacco use: Secondary | ICD-10-CM | POA: Insufficient documentation

## 2017-07-24 DIAGNOSIS — I10 Essential (primary) hypertension: Secondary | ICD-10-CM | POA: Insufficient documentation

## 2017-11-22 ENCOUNTER — Other Ambulatory Visit: Payer: Self-pay

## 2017-11-22 ENCOUNTER — Encounter (HOSPITAL_BASED_OUTPATIENT_CLINIC_OR_DEPARTMENT_OTHER): Payer: Self-pay | Admitting: Emergency Medicine

## 2017-11-22 ENCOUNTER — Emergency Department (HOSPITAL_BASED_OUTPATIENT_CLINIC_OR_DEPARTMENT_OTHER)
Admission: EM | Admit: 2017-11-22 | Discharge: 2017-11-22 | Disposition: A | Discharge status: Home / Self Care | Payer: Self-pay | Attending: Emergency Medicine | Admitting: Emergency Medicine

## 2017-11-22 DIAGNOSIS — F17210 Nicotine dependence, cigarettes, uncomplicated: Secondary | ICD-10-CM | POA: Insufficient documentation

## 2017-11-22 DIAGNOSIS — I10 Essential (primary) hypertension: Secondary | ICD-10-CM | POA: Insufficient documentation

## 2017-11-22 DIAGNOSIS — Z79899 Other long term (current) drug therapy: Secondary | ICD-10-CM | POA: Insufficient documentation

## 2017-11-22 DIAGNOSIS — K029 Dental caries, unspecified: Secondary | ICD-10-CM | POA: Insufficient documentation

## 2017-11-22 DIAGNOSIS — K047 Periapical abscess without sinus: Secondary | ICD-10-CM | POA: Insufficient documentation

## 2017-11-22 MED ORDER — AMOXICILLIN 500 MG PO CAPS: 500.0000 mg | ORAL_CAPSULE | Freq: Three times a day (TID) | ORAL | 0 refills | Status: DC

## 2017-11-22 MED ORDER — AMOXICILLIN 500 MG PO CAPS
500.0000 mg | ORAL_CAPSULE | Freq: Once | ORAL | Status: AC
Start: 2017-11-22 — End: 2017-11-22
  Administered 2017-11-22: 500 mg via ORAL
  Filled 2017-11-22: qty 1

## 2017-11-22 MED ORDER — TRAMADOL HCL 50 MG PO TABS
50.0000 mg | ORAL_TABLET | Freq: Once | ORAL | Status: AC
  Administered 2017-11-22: 50 mg via ORAL
  Filled 2017-11-22: qty 1

## 2017-11-22 MED ORDER — HYDROCODONE-ACETAMINOPHEN 5-325 MG PO TABS: 1.0000 | ORAL_TABLET | Freq: Four times a day (QID) | ORAL | 0 refills | Status: DC | PRN

## 2017-11-22 NOTE — ED Provider Notes (Signed)
[facility] EMERGENCY DEPARTMENT Provider Note   CSN: [id number] Arrival date & time: 11/22/17  1806     History   Chief Complaint Chief Complaint  Patient presents with  . Dental Pain    HPI Ross Best is a 31 y.o. male.  Patient c/o left upper dental pain/decay, and now swelling and increased pain to area for past 2-3 days. Pain dull, mod-sever, constant, worse w eating. Denies acute trauma to area. No fever or chills. Has dentist but cant see until Monday. No headache. No trouble breathing or swallowing.   The history is provided by the patient.  Dental Pain      Past Medical History:  Diagnosis Date  . Kidney stone     Patient Active Problem List   Diagnosis Date Noted  . Paresthesias in left hand 11/29/2015  . Left wrist pain 11/24/2015    Past Surgical History:  Procedure Laterality Date  . FOOT TUMOR EXCISION          Home Medications    Prior to Admission medications   Medication Sig Start Date End Date Taking? Authorizing Provider  lisinopril (PRINIVIL,ZESTRIL) 20 MG tablet Take 20 mg by mouth daily.   Yes [provider]  meloxicam (MOBIC) 15 MG tablet Take 15 mg by mouth daily.   Yes [provider]  albuterol (PROVENTIL HFA;VENTOLIN HFA) 108 (90 Base) MCG/ACT inhaler Inhale 2 puffs into the lungs every 2 (two) hours as needed for wheezing or shortness of breath (cough). 02/01/16   Doug Sou, MD  amitriptyline (ELAVIL) 25 MG tablet Take 1 tablet (25 mg total) by mouth at bedtime as needed (hand tingling). 11/29/15   Rodolph Bong, MD  benzonatate (TESSALON) 100 MG capsule Take 1 capsule (100 mg total) by mouth every 8 (eight) hours. 02/01/16   Doug Sou, MD  methylPREDNISolone (MEDROL DOSEPAK) 4 MG TBPK tablet dosepack po 12/05/15   Rodolph Bong, MD  predniSONE (STERAPRED UNI-PAK 48 TAB) 10 MG (48) TBPK tablet Take by mouth daily. 12 day dosepack po 11/24/15   Rodolph Bong, MD  Spacer/Aero-Holding Chambers  (AEROCHAMBER PLUS WITH MASK) inhaler Use as instructed 02/01/16   Doug Sou, MD    Family History History reviewed. No pertinent family history.  Social History Social History   Tobacco Use  . Smoking status: Current Every Day Smoker    Packs/day: 1.00    Types: Cigarettes  . Smokeless tobacco: Current User    Types: Snuff  Substance Use Topics  . Alcohol use: Yes    Comment: 1x month  . Drug use: No     Allergies   Patient has no known allergies.   Review of Systems Review of Systems  Constitutional: Negative for fever.  HENT: Negative for sore throat and trouble swallowing.   Respiratory: Negative for shortness of breath.   Musculoskeletal: Negative for neck pain and neck stiffness.  Neurological: Negative for headaches.     Physical Exam Updated Vital Signs BP (!) 158/110 (BP Location: Left Arm)   Pulse 90   Temp 98.3 F (36.8 C) (Oral)   Resp 18   Ht 1.829 m (6')   Wt 124.7 kg (275 lb)   SpO2 100%   BMI 37.30 kg/m   Physical Exam  Constitutional: He appears well-developed and well-nourished. No distress.  HENT:  Nose: Nose normal.  Mouth/Throat: Oropharynx is clear and moist.  Teeth 9, 10, and 11 with decay, soft tissue/gum swelling over tooth 11. No facial  erythema. No fluctuance noted. No trismus. Pharynx normal.   Eyes: Pupils are equal, round, and reactive to light. Conjunctivae are normal.  Neck: Neck supple. No tracheal deviation present.  Cardiovascular: Normal rate.  Pulmonary/Chest: Effort normal. No accessory muscle usage. No respiratory distress.  Musculoskeletal: He exhibits no edema.  Neurological: He is alert.  Skin: Skin is warm and dry. No rash noted. He is not diaphoretic.  Psychiatric: He has a normal mood and affect.  Nursing note and vitals reviewed.    ED Treatments / Results  Labs (all labs ordered are listed, but only abnormal results are displayed) Labs Reviewed - No data to display  EKG None  Radiology No  results found.  Procedures Procedures (including critical care time)  Medications Ordered in ED Medications  amoxicillin (AMOXIL) capsule 500 mg (has no administration in time range)  traMADol (ULTRAM) tablet 50 mg (has no administration in time range)     Initial Impression / Assessment and Plan / ED Course  I have reviewed the triage vital signs and the nursing notes.  Pertinent labs & imaging results that were available during my care of the patient were reviewed by me and considered in my medical decision making (see chart for details).  Confirmed nkda.   amox po. Pt indicates otc meds not helping pain. Pt indicates has ride, does not have to drive. Ultram po.  Pt has dentist - will f/u Monday.  bp high in ED, hx htn, will have f/u with pcp for recheck this week.   Final Clinical Impressions(s) / ED Diagnoses   Final diagnoses:  None    ED Discharge Orders    None       Cathren LaineSteinl, Shontia Gillooly, MD 11/22/17 2010

## 2017-11-22 NOTE — ED Triage Notes (Addendum)
Patient started to have pain to his left teeth and gums x 3 days  - patient reports that he also is feeling hot and cold, is hypertensive and states that he is having some dizziness

## 2017-11-22 NOTE — Discharge Instructions (Addendum)
It was our pleasure to provide your ER care today - we hope that you feel better.  Take amoxicillin (antibiotic).   Take motrin or aleve as need for pain. You may also take hydrocodone as need for pain. No driving for the next 6 hours or when taking hydrocodone. Also, do not take tylenol or acetaminophen containing medication when taking hydrocodone.  Follow up with your dentist Monday.  Your blood pressure is high tonight - follow up with primary care doctor in the coming week.  Return to ER if worse, other concern.

## 2018-07-13 ENCOUNTER — Ambulatory Visit: Payer: PRIVATE HEALTH INSURANCE | Admitting: Family Medicine

## 2018-07-13 ENCOUNTER — Encounter: Payer: Self-pay | Admitting: Family Medicine

## 2018-07-13 DIAGNOSIS — G5602 Carpal tunnel syndrome, left upper limb: Secondary | ICD-10-CM

## 2018-07-13 DIAGNOSIS — G5600 Carpal tunnel syndrome, unspecified upper limb: Secondary | ICD-10-CM | POA: Insufficient documentation

## 2018-07-13 MED ORDER — GABAPENTIN 300 MG PO CAPS: 300.0000 mg | ORAL_CAPSULE | Freq: Every evening | ORAL | 1 refills | Status: DC | PRN

## 2018-07-13 NOTE — Patient Instructions (Signed)
Thank you for coming in today. You should hear about the nerve study soon.  Let me know if you do not hear anything.  Take gabapentin at bedtime as needed for pain.  It may make you sleepy.  Use the wrist brace at bedtime as needed.   Recheck if not getting better.    Carpal Tunnel Syndrome Carpal tunnel syndrome is a condition that causes pain in your hand and arm. The carpal tunnel is a narrow area located on the palm side of your wrist. Repeated wrist motion or certain diseases may cause swelling within the tunnel. This swelling pinches the main nerve in the wrist (median nerve). What are the causes? This condition may be caused by:  Repeated wrist motions.  Wrist injuries.  Arthritis.  A cyst or tumor in the carpal tunnel.  Fluid buildup during pregnancy.  Sometimes the cause of this condition is not known. What increases the risk? This condition is more likely to develop in:  People who have jobs that cause them to repeatedly move their wrists in the same motion, such as Health visitor.  Women.  People with certain conditions, such as: ? Diabetes. ? Obesity. ? An underactive thyroid (hypothyroidism). ? Kidney failure.  What are the signs or symptoms? Symptoms of this condition include:  A tingling feeling in your fingers, especially in your thumb, index, and middle fingers.  Tingling or numbness in your hand.  An aching feeling in your entire arm, especially when your wrist and elbow are bent for long periods of time.  Wrist pain that goes up your arm to your shoulder.  Pain that goes down into your palm or fingers.  A weak feeling in your hands. You may have trouble grabbing and holding items.  Your symptoms may feel worse during the night. How is this diagnosed? This condition is diagnosed with a medical history and physical exam. You may also have tests, including:  An electromyogram (EMG). This test measures electrical signals sent by your  nerves into the muscles.  X-rays.  How is this treated? Treatment for this condition includes:  Lifestyle changes. It is important to stop doing or modify the activity that caused your condition.  Physical or occupational therapy.  Medicines for pain and inflammation. This may include medicine that is injected into your wrist.  A wrist splint.  Surgery.  Follow these instructions at home: If you have a splint:  Wear it as told by your health care provider. Remove it only as told by your health care provider.  Loosen the splint if your fingers become numb and tingle, or if they turn cold and blue.  Keep the splint clean and dry. General instructions  Take over-the-counter and prescription medicines only as told by your health care provider.  Rest your wrist from any activity that may be causing your pain. If your condition is work related, talk to your employer about changes that can be made, such as getting a wrist pad to use while typing.  If directed, apply ice to the painful area: ? Put ice in a plastic bag. ? Place a towel between your skin and the bag. ? Leave the ice on for 20 minutes, 2-3 times per day.  Keep all follow-up visits as told by your health care provider. This is important.  Do any exercises as told by your health care provider, physical therapist, or occupational therapist. Contact a health care provider if:  You have new symptoms.  Your pain is  not controlled with medicines.  Your symptoms get worse. This information is not intended to replace advice given to you by your health care provider. Make sure you discuss any questions you have with your health care provider. Document Released: 07/19/2000 Document Revised: 11/30/2015 Document Reviewed: 12/07/2014 Elsevier Interactive Patient Education  [employer]2018 [employer]

## 2018-07-13 NOTE — Progress Notes (Signed)
Ross Best is a 32 y.o. male who presents to [facility] today for left hand numbness and tingling.  Patient was seen for left hand pain and numbness and tingling in 2017.  He notes this has been mildly ongoing until recently when it worsened.  He notes numbness and tingling into the palm of his left hand to the thumb index finger middle finger and part of the ring finger.  He notes this is worse with activity and at bedtime.  He has not tried much treatment yet.  He notes some soreness into his volar and dorsal forearm as well.  He denies any recent injury neck pain or weakness.  He feels well otherwise with no fevers or chills.    ROS:  As above  Exam:  BP (!) 141/93   Pulse 64   Ht 6\' 1"  (1.854 m)   Wt 276 lb (125.2 kg)   BMI 36.41 kg/m  General: Well Developed, well nourished, and in no acute distress.  Neuro/Psych: Alert and oriented x3, extra-ocular muscles intact, able to move all 4 extremities, sensation grossly intact. Skin: Warm and dry, no rashes noted.  Respiratory: Not using accessory muscles, speaking in full sentences, trachea midline.  Cardiovascular: Pulses palpable, no extremity edema. Abdomen: Does not appear distended. MSK:  C-spine nontender to midline normal neck motion upper extremity strength reflexes and sensation are equal normal throughout bilateral upper extremities. Left elbow normal-appearing nontender normal motion. Left wrist normal-appearing nontender normal motion.  Negative Tinel's mildly positive Phalen's test. Grip strength is intact. Right hand normal-appearing nontender normal motion negative Tinel's and Phalen's test.    Lab and Radiology Results Limited musculoskeletal ultrasound of left wrist reveals an intact appearing carpal tunnel.  Median nerve identified and measures cross-sectional area of about 1 cm. Impression within normal limits for carpal tunnel     Assessment and Plan: 32  y.o. male with right hand paresthesias likely in the distribution of median nerve due to carpal tunnel syndrome.  Ultrasound was nondiagnostic.  His exam today is also mildly symptomatic.  I think it is reasonable at this point to proceed with nerve conduction study to evaluate for carpal tunnel syndrome versus other causes of hand pain and paresthesia.  We will also prescribe gabapentin for use at bedtime and a night splint as well.  Recheck if not improving.    Orders Placed This Encounter  Procedures  . NCV with EMG(electromyography)    Standing Status:   Future    Standing Expiration Date:   07/14/2019    Order Specific Question:   Where should this test be performed?    Answer:   other   Meds ordered this encounter  Medications  . gabapentin (NEURONTIN) 300 MG capsule    Sig: Take 1 capsule (300 mg total) by mouth at bedtime as needed (pain).    Dispense:  30 capsule    Refill:  1    Historical information moved to improve visibility of documentation.  Past Medical History:  Diagnosis Date  . Kidney stone    Past Surgical History:  Procedure Laterality Date  . FOOT TUMOR EXCISION     Social History   Tobacco Use  . Smoking status: Current Every Day Smoker    Packs/day: 1.00    Types: Cigarettes  . Smokeless tobacco: Current User    Types: Snuff  Substance Use Topics  . Alcohol use: Yes    Comment: 1x month   family  history is not on file.  Medications: Current Outpatient Medications  Medication Sig Dispense Refill  . albuterol (PROVENTIL HFA;VENTOLIN HFA) 108 (90 Base) MCG/ACT inhaler Inhale 2 puffs into the lungs every 2 (two) hours as needed for wheezing or shortness of breath (cough). 1 Inhaler 0  . lisinopril (PRINIVIL,ZESTRIL) 20 MG tablet Take 20 mg by mouth daily.    . meloxicam (MOBIC) 15 MG tablet Take 15 mg by mouth daily.    Marland Kitchen. gabapentin (NEURONTIN) 300 MG capsule Take 1 capsule (300 mg total) by mouth at bedtime as needed (pain). 30 capsule 1   No  current facility-administered medications for this visit.    No Known Allergies    Discussed warning signs or symptoms. Please see discharge instructions. Patient expresses understanding.

## 2018-08-12 ENCOUNTER — Telehealth: Payer: Self-pay | Admitting: Neurology

## 2018-08-12 ENCOUNTER — Encounter: Payer: Managed Care, Other (non HMO) | Admitting: Neurology

## 2018-08-12 NOTE — Telephone Encounter (Signed)
This patient did not show for an EMG and nerve conduction study evaluation today. 

## 2018-08-13 ENCOUNTER — Encounter: Payer: Self-pay | Admitting: Neurology

## 2020-05-10 DIAGNOSIS — Z8249 Family history of ischemic heart disease and other diseases of the circulatory system: Secondary | ICD-10-CM | POA: Insufficient documentation

## 2020-05-10 DIAGNOSIS — E785 Hyperlipidemia, unspecified: Secondary | ICD-10-CM | POA: Insufficient documentation

## 2020-10-30 ENCOUNTER — Encounter (HOSPITAL_BASED_OUTPATIENT_CLINIC_OR_DEPARTMENT_OTHER): Payer: Self-pay | Admitting: Obstetrics and Gynecology

## 2020-10-30 ENCOUNTER — Other Ambulatory Visit: Payer: Self-pay

## 2020-10-30 ENCOUNTER — Emergency Department (HOSPITAL_BASED_OUTPATIENT_CLINIC_OR_DEPARTMENT_OTHER): Payer: PRIVATE HEALTH INSURANCE | Admitting: Radiology

## 2020-10-30 ENCOUNTER — Emergency Department (HOSPITAL_BASED_OUTPATIENT_CLINIC_OR_DEPARTMENT_OTHER)
Admission: EM | Admit: 2020-10-30 | Discharge: 2020-10-30 | Disposition: A | Discharge status: Home / Self Care | Payer: PRIVATE HEALTH INSURANCE | Attending: Emergency Medicine | Admitting: Emergency Medicine

## 2020-10-30 DIAGNOSIS — Z79899 Other long term (current) drug therapy: Secondary | ICD-10-CM | POA: Insufficient documentation

## 2020-10-30 DIAGNOSIS — S61511A Laceration without foreign body of right wrist, initial encounter: Secondary | ICD-10-CM | POA: Insufficient documentation

## 2020-10-30 DIAGNOSIS — Z23 Encounter for immunization: Secondary | ICD-10-CM | POA: Insufficient documentation

## 2020-10-30 DIAGNOSIS — I10 Essential (primary) hypertension: Secondary | ICD-10-CM | POA: Insufficient documentation

## 2020-10-30 DIAGNOSIS — F17210 Nicotine dependence, cigarettes, uncomplicated: Secondary | ICD-10-CM | POA: Insufficient documentation

## 2020-10-30 DIAGNOSIS — W228XXA Striking against or struck by other objects, initial encounter: Secondary | ICD-10-CM | POA: Insufficient documentation

## 2020-10-30 HISTORY — DX: Essential (primary) hypertension: I10

## 2020-10-30 MED ORDER — LIDOCAINE-EPINEPHRINE (PF) 2 %-1:200000 IJ SOLN
10.0000 mL | Freq: Once | INTRAMUSCULAR | Status: AC
  Administered 2020-10-30: 10 mL
  Filled 2020-10-30: qty 20

## 2020-10-30 MED ORDER — TETANUS-DIPHTH-ACELL PERTUSSIS 5-2.5-18.5 LF-MCG/0.5 IM SUSY
0.5000 mL | PREFILLED_SYRINGE | Freq: Once | INTRAMUSCULAR | Status: AC
  Administered 2020-10-30: 0.5 mL via INTRAMUSCULAR
  Filled 2020-10-30: qty 0.5

## 2020-10-30 NOTE — ED Provider Notes (Signed)
[facility] EMERGENCY DEPT Provider Note   CSN: [id number] Arrival date & time: 10/30/20  1527     History Chief Complaint  Patient presents with  . Wrist Injury    Ross Best is a 35 y.o. male.  The history is provided by the patient.  Wrist Injury  Ross Best is a 35 y.o. male who presents to the Emergency Department complaining of wrist injury. He presents the emergency department for evaluation of right wrist injury. He states that he was taking a ditch when he slipped and his right wrist got caught on bricks. He states there was a large amount of bleeding at the time. He is left-hand dominant. He has no significant pain to the wrist or hand. Last tetanus is unknown. Symptoms are moderate and constant.    Past Medical History:  Diagnosis Date  . Hypertension   . Kidney stone     Patient Active Problem List   Diagnosis Date Noted  . Carpal tunnel syndrome 07/13/2018  . Paresthesias in left hand 11/29/2015  . Left wrist pain 11/24/2015    Past Surgical History:  Procedure Laterality Date  . FOOT TUMOR EXCISION         No family history on file.  Social History   Tobacco Use  . Smoking status: Current Every Day Smoker    Packs/day: 1.00    Years: 15.00    Pack years: 15.00    Types: Cigarettes  . Smokeless tobacco: Current User    Types: Snuff  Vaping Use  . Vaping Use: Never used  Substance Use Topics  . Alcohol use: Yes    Comment: 1x month  . Drug use: No    Home Medications Prior to Admission medications   Medication Sig Start Date End Date Taking? Authorizing Provider  albuterol (PROVENTIL HFA;VENTOLIN HFA) 108 (90 Base) MCG/ACT inhaler Inhale 2 puffs into the lungs every 2 (two) hours as needed for wheezing or shortness of breath (cough). 02/01/16   Doug Sou, MD  gabapentin (NEURONTIN) 300 MG capsule Take 1 capsule (300 mg total) by mouth at bedtime as needed (pain). 07/13/18   Rodolph Bong, MD  lisinopril  (PRINIVIL,ZESTRIL) 20 MG tablet Take 20 mg by mouth daily.    [provider]  meloxicam (MOBIC) 15 MG tablet Take 15 mg by mouth daily.    [provider]    Allergies    Patient has no known allergies.  Review of Systems   Review of Systems  All other systems reviewed and are negative.   Physical Exam Updated Vital Signs BP (!) 142/99 (BP Location: Left Arm)   Pulse 96   Temp 98.5 F (36.9 C) (Oral)   Resp 16   Ht 6\' 1"  (1.854 m)   Wt 127 kg   SpO2 95%   BMI 36.94 kg/m   Physical Exam Vitals and nursing note reviewed.  Constitutional:      Appearance: He is well-developed.  HENT:     Head: Normocephalic and atraumatic.  Cardiovascular:     Rate and Rhythm: Normal rate and regular rhythm.  Pulmonary:     Effort: Pulmonary effort is normal. No respiratory distress.  Musculoskeletal:        General: No tenderness.     Comments: 2+ right radial pulses. Range of motion intact throughout the digits. Sensation light touch intact throughout the digits. There is no bony tenderness. There is a laceration over the radial aspect of the volar wrist, approx  2cm in length.    Skin:    General: Skin is warm and dry.  Neurological:     Mental Status: He is alert and oriented to person, place, and time.  Psychiatric:        Behavior: Behavior normal.     ED Results / Procedures / Treatments   Labs (all labs ordered are listed, but only abnormal results are displayed) Labs Reviewed - No data to display  EKG None  Radiology No results found.  Procedures .Marland KitchenLaceration Repair  Date/Time: 10/30/2020 4:26 PM Performed by: Tilden Fossa, MD Authorized by: Tilden Fossa, MD   Consent:    Consent obtained:  Verbal   Consent given by:  Patient   Risks discussed:  Infection and pain Universal protocol:    Patient identity confirmed:  Verbally with patient Anesthesia:    Anesthesia method:  Local infiltration   Local anesthetic:  Lidocaine 2% WITH  epi Laceration details:    Location:  Shoulder/arm   Shoulder/arm location:  R lower arm   Length (cm):  2 Pre-procedure details:    Preparation:  Patient was prepped and draped in usual sterile fashion Exploration:    Hemostasis achieved with:  Direct pressure   Wound exploration: wound explored through full range of motion     Contaminated: yes   Treatment:    Area cleansed with:  Saline, chlorhexidine and soap and water   Amount of cleaning:  Standard   Irrigation solution:  Sterile saline and tap water   Irrigation method:  Syringe Skin repair:    Repair method:  Sutures   Suture size:  4-0   Suture material:  Prolene   Suture technique:  Simple interrupted   Number of sutures:  2 Approximation:    Approximation:  Close Repair type:    Repair type:  Simple Post-procedure details:    Procedure completion:  Tolerated     Medications Ordered in ED Medications  Tdap (BOOSTRIX) injection 0.5 mL (0.5 mLs Intramuscular Given 10/30/20 1601)  lidocaine-EPINEPHrine (XYLOCAINE W/EPI) 2 %-1:200000 (PF) injection 10 mL (10 mLs Infiltration Given by Other 10/30/20 1600)    ED Course  I have reviewed the triage vital signs and the nursing notes.  Pertinent labs & imaging results that were available during my care of the patient were reviewed by me and considered in my medical decision making (see chart for details).    MDM Rules/Calculators/A&P                  patient here for evaluation of right wrist injury. He has a laceration on examination, no evidence of vascular injury or tendon injury. The wound is dirty and covered in soil. Wound was irrigated and repaired. Following irrigation, no clear evidence of remaining foreign body. Discussed with patient wound care, outpatient follow-up and return precautions. Final Clinical Impression(s) / ED Diagnoses Final diagnoses:  Wrist laceration, right, initial encounter    Rx / DC Orders ED Discharge Orders    None       Tilden Fossa, MD 10/30/20 1628

## 2020-10-30 NOTE — ED Triage Notes (Signed)
Patient reports to the ER for right wrist laceration while attempting to dig a trench. Patient reports a brick cut his wrist open. Patient denies blood thinners. Bleeding controlled with pressure at this time. TDAP not Up to date.

## 2021-08-30 ENCOUNTER — Ambulatory Visit (INDEPENDENT_AMBULATORY_CARE_PROVIDER_SITE_OTHER): Payer: 59 | Admitting: Podiatry

## 2021-08-30 ENCOUNTER — Other Ambulatory Visit: Payer: Self-pay

## 2021-08-30 ENCOUNTER — Encounter: Payer: Self-pay | Admitting: Podiatry

## 2021-08-30 ENCOUNTER — Ambulatory Visit: Payer: 59

## 2021-08-30 ENCOUNTER — Ambulatory Visit (INDEPENDENT_AMBULATORY_CARE_PROVIDER_SITE_OTHER): Payer: 59

## 2021-08-30 DIAGNOSIS — M19079 Primary osteoarthritis, unspecified ankle and foot: Secondary | ICD-10-CM

## 2021-08-30 DIAGNOSIS — Q666 Other congenital valgus deformities of feet: Secondary | ICD-10-CM | POA: Diagnosis not present

## 2021-08-30 DIAGNOSIS — M778 Other enthesopathies, not elsewhere classified: Secondary | ICD-10-CM

## 2021-08-30 DIAGNOSIS — M21869 Other specified acquired deformities of unspecified lower leg: Secondary | ICD-10-CM

## 2021-08-30 NOTE — Progress Notes (Signed)
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 male presents with the above complaint.   ROS: Denies fever chills nausea vomiting mobic muscle aches pains calf pain back pain chest pain shortness of breath.  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  Is complaining of pain in his hands as well.  He does have a family history of rheumatoid arthritis.  Past Medical History:  Diagnosis Date   Hypertension    Kidney stone    Past Surgical History:  Procedure Laterality Date   FOOT TUMOR EXCISION      Current Outpatient Medications:    escitalopram (LEXAPRO) 5 MG tablet, Take 5 mg by mouth daily., Disp: , Rfl:    losartan (COZAAR) 100 MG tablet, Take 100 mg by mouth daily., Disp: , Rfl:   No Known Allergies Review of Systems Objective:  There were no vitals filed for this visit.  General: Well developed, nourished, in no acute distress, alert and oriented x3   Dermatological: Skin is warm, dry and supple bilateral. Nails x 10 are well maintained; remaining integument appears unremarkable at this time. There are no open sores, no preulcerative lesions, no rash or signs of infection present.  Vascular: Dorsalis Pedis artery and Posterior Tibial artery pedal pulses are 2/4 bilateral with immedate capillary fill time. Pedal hair growth present. No varicosities and no lower extremity edema present bilateral.   Neruologic: Grossly intact via light touch bilateral. Vibratory intact via tuning fork bilateral. Protective threshold with Semmes Wienstein monofilament intact to all pedal sites bilateral. Patellar and Achilles  deep tendon reflexes 2+ bilateral. No Babinski or clonus noted bilateral.   Musculoskeletal: No gross boney pedal deformities bilateral. No pain, crepitus, or limitation noted with foot and ankle range of motion bilateral. Muscular strength 5/5 in all groups tested bilateral.  Pes planovalgus right foot over the left.  Pain in the midfoot tarsometatarsal joints and Lisfranc's joints.  Also demonstrates gastroc equinus bilaterally.  Gait: Unassisted, Nonantalgic.    Radiographs:  Radiographs taken today demonstrate osseously mature individual right foot does demonstrate pes planovalgus with osteoarthritic changes of the midfoot.  Metatarsophalangeal joints appear to be relatively normal.  Left foot does demonstrate mild pes planus but not nearly as much jamming in the midfoot.  Assessment & Plan:   Assessment: Pes planovalgus osteoarthritic changes right foot cannot rule out rheumatoid changes.  Left foot similar findings but not as much arthritic change.  Plan: Discussed etiology pathology conservative versus surgical therapies at this point were going to go ahead and have blood work done requesting an arthritic profile and referring to rheumatology for his history rheumatoid in the family as well as his back pain and changes in his metacarpal phalangeal joints.  For also requesting that he see Aaron Edelman for orthotics.     Dimitriy Carreras T. [place], [place]

## 2021-08-30 NOTE — Progress Notes (Signed)
SITUATION Reason for Consult: Evaluation for Bilateral Custom Foot Orthoses Patient / Caregiver Report: Patient is ready for foot orthotics  OBJECTIVE DATA: Patient History / Diagnosis:    ICD-10-CM   1. Capsulitis of foot, unspecified laterality  M77.8       Current or Previous Devices: None and no history  Foot Examination: Skin presentation:   Intact Ulcers & Callousing:   None and no history Toe / Foot Deformities:  Prominent met heads Weight Bearing Presentation:  Rectus Sensation:    Intact  ORTHOTIC RECOMMENDATION Recommended Device: 1x pair of custom functional foot orthotics  GOALS OF ORTHOSES - Reduce Pain - Prevent Foot Deformity - Prevent Progression of Further Foot Deformity - Relieve Pressure - Improve the Overall Biomechanical Function of the Foot and Lower Extremity.  ACTIONS PERFORMED Patient was casted for Foot Orthoses via crush box. Procedure was explained and patient tolerated procedure well. All questions were answered and concerns addressed.  PLAN Potential out of pocket cost was communicated to patient. Casts are to be sent to [facility] for fabrication. Patient is to be called for fitting when devices are ready.

## 2021-09-02 LAB — CBC WITH DIFFERENTIAL/PLATELET
Absolute Monocytes: 608 {cells}/uL (ref 200–950)
Basophils Absolute: 46 {cells}/uL (ref 0–200)
Basophils Relative: 0.6 %
Eosinophils Absolute: 216 {cells}/uL (ref 15–500)
Eosinophils Relative: 2.8 %
HCT: 47.6 % (ref 38.5–50.0)
Hemoglobin: 16 g/dL (ref 13.2–17.1)
Lymphs Abs: 1709 {cells}/uL (ref 850–3900)
MCH: 28.3 pg (ref 27.0–33.0)
MCHC: 33.6 g/dL (ref 32.0–36.0)
MCV: 84.1 fL (ref 80.0–100.0)
MPV: 12.7 fL — ABNORMAL HIGH (ref 7.5–12.5)
Monocytes Relative: 7.9 %
Neutro Abs: 5121 {cells}/uL (ref 1500–7800)
Neutrophils Relative %: 66.5 %
Platelets: 252 Thousand/uL (ref 140–400)
RBC: 5.66 Million/uL (ref 4.20–5.80)
RDW: 12.2 % (ref 11.0–15.0)
Total Lymphocyte: 22.2 %
WBC: 7.7 Thousand/uL (ref 3.8–10.8)

## 2021-09-02 LAB — RHEUMATOID FACTOR: Rheumatoid fact SerPl-aCnc: <14 [IU]/mL (ref <14)

## 2021-09-02 LAB — URIC ACID: Uric Acid, Serum: 6.8 mg/dL (ref 4.0–8.0)

## 2021-09-02 LAB — C-REACTIVE PROTEIN: CRP: 6.2 mg/L (ref <8.0)

## 2021-09-02 LAB — SEDIMENTATION RATE: Sed Rate: 2 mm/h (ref 0–15)

## 2021-09-02 LAB — ANA: Anti Nuclear Antibody (ANA): NEGATIVE

## 2021-09-07 ENCOUNTER — Ambulatory Visit: Payer: 59 | Admitting: Podiatry

## 2021-10-02 ENCOUNTER — Other Ambulatory Visit: Payer: Self-pay

## 2021-10-02 ENCOUNTER — Ambulatory Visit: Payer: 59

## 2021-10-02 DIAGNOSIS — Q666 Other congenital valgus deformities of feet: Secondary | ICD-10-CM

## 2021-10-02 DIAGNOSIS — M778 Other enthesopathies, not elsewhere classified: Secondary | ICD-10-CM

## 2021-10-02 NOTE — Progress Notes (Signed)
SITUATION: Reason for Visit: Fitting and Delivery of Custom Fabricated Foot Orthoses Patient Report: Patient reports comfort and is satisfied with device.  OBJECTIVE DATA: Patient History / Diagnosis:     ICD-10-CM   1. Capsulitis of foot, unspecified laterality  M77.8     2. Pes planovalgus  Q66.6       Provided Device:  Custom Functional Foot Orthotics     [facility] Labs: [id number]  GOAL OF ORTHOSIS - Improve gait - Decrease energy expenditure - Improve Balance - Provide Triplanar stability of foot complex - Facilitate motion  ACTIONS PERFORMED Patient was fit with foot orthotics trimmed to shoe last. Patient tolerated fittign procedure.   Patient was provided with verbal and written instruction and demonstration regarding donning, doffing, wear, care, proper fit, function, purpose, cleaning, and use of the orthosis and in all related precautions and risks and benefits regarding the orthosis.  Patient was also provided with verbal instruction regarding how to report any failures or malfunctions of the orthosis and necessary follow up care. Patient was also instructed to contact our office regarding any change in status that may affect the function of the orthosis.  Patient demonstrated independence with proper donning, doffing, and fit and verbalized understanding of all instructions.  PLAN: Patient is to follow up in one week or as necessary (PRN). All questions were answered and concerns addressed. Plan of care was discussed with and agreed upon by the patient.

## 2021-10-11 ENCOUNTER — Encounter (INDEPENDENT_AMBULATORY_CARE_PROVIDER_SITE_OTHER): Payer: Self-pay | Admitting: Podiatry

## 2021-10-11 NOTE — Progress Notes (Signed)
NO SHOW. This encounter was created in error - please disregard.

## 2021-11-03 ENCOUNTER — Other Ambulatory Visit: Payer: Self-pay

## 2021-11-03 ENCOUNTER — Emergency Department (HOSPITAL_BASED_OUTPATIENT_CLINIC_OR_DEPARTMENT_OTHER)
Admission: EM | Admit: 2021-11-03 | Discharge: 2021-11-04 | Disposition: A | Discharge status: Home / Self Care | Payer: 59 | Attending: Emergency Medicine | Admitting: Emergency Medicine

## 2021-11-03 ENCOUNTER — Emergency Department (HOSPITAL_BASED_OUTPATIENT_CLINIC_OR_DEPARTMENT_OTHER): Payer: 59

## 2021-11-03 ENCOUNTER — Encounter (HOSPITAL_BASED_OUTPATIENT_CLINIC_OR_DEPARTMENT_OTHER): Payer: Self-pay | Admitting: *Deleted

## 2021-11-03 DIAGNOSIS — K2900 Acute gastritis without bleeding: Secondary | ICD-10-CM | POA: Insufficient documentation

## 2021-11-03 DIAGNOSIS — R1013 Epigastric pain: Secondary | ICD-10-CM | POA: Diagnosis present

## 2021-11-03 LAB — BASIC METABOLIC PANEL
Anion gap: 10 (ref 5–15)
BUN: 9 mg/dL (ref 6–20)
CO2: 26 mmol/L (ref 22–32)
Calcium: 9.1 mg/dL (ref 8.9–10.3)
Chloride: 104 mmol/L (ref 98–111)
Creatinine, Ser: 0.9 mg/dL (ref 0.61–1.24)
GFR, Estimated: >60 mL/min (ref >60)
Glucose, Bld: 118 mg/dL — ABNORMAL HIGH (ref 70–99)
Potassium: 3.6 mmol/L (ref 3.5–5.1)
Sodium: 140 mmol/L (ref 135–145)

## 2021-11-03 LAB — CBC
HCT: 46.3 % (ref 39.0–52.0)
Hemoglobin: 15.3 g/dL (ref 13.0–17.0)
MCH: 27.3 pg (ref 26.0–34.0)
MCHC: 33 g/dL (ref 30.0–36.0)
MCV: 82.5 fL (ref 80.0–100.0)
Platelets: 262 10*3/uL (ref 150–400)
RBC: 5.61 MIL/uL (ref 4.22–5.81)
RDW: 12.2 % (ref 11.5–15.5)
WBC: 9.6 10*3/uL (ref 4.0–10.5)
nRBC: 0 % (ref 0.0–0.2)

## 2021-11-03 LAB — TROPONIN I (HIGH SENSITIVITY): Troponin I (High Sensitivity): <2 ng/L (ref <18)

## 2021-11-03 NOTE — ED Triage Notes (Signed)
Pt has been having epigastric pain for 3 days.  Pt reports family hx of heart disease but has not been dx with anything himself.  Pt has had a cardiac stress test in the past and it was normal.  ?

## 2021-11-03 NOTE — ED Notes (Signed)
Portable at bedside 

## 2021-11-04 LAB — TROPONIN I (HIGH SENSITIVITY): Troponin I (High Sensitivity): 2 ng/L (ref <18)

## 2021-11-04 MED ORDER — ALUM & MAG HYDROXIDE-SIMETH 200-200-20 MG/5ML PO SUSP
30.0000 mL | Freq: Once | ORAL | Status: AC
  Administered 2021-11-04: 30 mL via ORAL
  Filled 2021-11-04: qty 30

## 2021-11-04 MED ORDER — LIDOCAINE VISCOUS HCL 2 % MT SOLN
15.0000 mL | Freq: Once | OROMUCOSAL | Status: AC
Start: 2021-11-04 — End: 2021-11-04
  Administered 2021-11-04: 15 mL via ORAL
  Filled 2021-11-04: qty 15

## 2021-11-04 MED ORDER — PANTOPRAZOLE SODIUM 40 MG PO TBEC: 40.0000 mg | DELAYED_RELEASE_TABLET | Freq: Every day | ORAL | 3 refills | Status: DC

## 2021-11-04 MED ORDER — SUCRALFATE 1 GM/10ML PO SUSP: 1.0000 g | Freq: Three times a day (TID) | ORAL | 0 refills | Status: AC

## 2021-11-04 MED ORDER — FAMOTIDINE 20 MG PO TABS
40.0000 mg | ORAL_TABLET | Freq: Once | ORAL | Status: AC
  Administered 2021-11-04: 40 mg via ORAL
  Filled 2021-11-04: qty 2

## 2021-11-04 NOTE — ED Provider Notes (Signed)
?[facility]PT ?Provider Note ? ? ?CSN: [id number] ?Arrival date & time: 11/03/21  2057 ? ?  ? ?History ? ?Chief Complaint  ?Patient presents with  ? Chest Pain  ? ? ?Ross Best is a 36 y.o. male. ? ?Patient presents to the emergency department for evaluation of epigastric pain.  Symptoms have been present for 3 days.  No associated nausea, vomiting, diarrhea.  No hematemesis or melena.  Denies shortness of breath. ? ? ?  ? ?Home Medications ?Prior to Admission medications   ?Medication Sig Start Date End Date Taking? Authorizing Provider  ?pantoprazole (PROTONIX) 40 MG tablet Take 1 tablet (40 mg total) by mouth daily. 11/04/21  Yes Maralee Higuchi, Canary Brim, MD  ?sucralfate (CARAFATE) 1 GM/10ML suspension Take 10 mLs (1 g total) by mouth 4 (four) times daily -  with meals and at bedtime. 11/04/21  Yes Mariea Mcmartin, Canary Brim, MD  ?escitalopram (LEXAPRO) 5 MG tablet Take 5 mg by mouth daily.    [provider]  ?losartan (COZAAR) 100 MG tablet Take 100 mg by mouth daily.    [provider]  ?   ? ?Allergies    ?Patient has no known allergies.   ? ?Review of Systems   ?Review of Systems ? ?Physical Exam ?Updated Vital Signs ?BP 116/80 (BP Location: Left Arm)   Pulse 62   Temp 98.8 ?F (37.1 ?C)   Resp 16   Wt 127 kg   SpO2 97%   BMI 36.94 kg/m?  ?Physical Exam ?Vitals and nursing note reviewed.  ?Constitutional:   ?   General: He is not in acute distress. ?   Appearance: He is well-developed.  ?HENT:  ?   Head: Normocephalic and atraumatic.  ?   Mouth/Throat:  ?   Mouth: Mucous membranes are moist.  ?Eyes:  ?   General: Vision grossly intact. Gaze aligned appropriately.  ?   Extraocular Movements: Extraocular movements intact.  ?   Conjunctiva/sclera: Conjunctivae normal.  ?Cardiovascular:  ?   Rate and Rhythm: Normal rate and regular rhythm.  ?   Pulses: Normal pulses.  ?   Heart sounds: Normal heart sounds, S1 normal and S2 normal. No murmur heard. ?  No friction rub.  No gallop.  ?Pulmonary:  ?   Effort: Pulmonary effort is normal. No respiratory distress.  ?   Breath sounds: Normal breath sounds.  ?Abdominal:  ?   Palpations: Abdomen is soft.  ?   Tenderness: There is abdominal tenderness in the epigastric area. There is no guarding or rebound.  ?   Hernia: No hernia is present.  ?Musculoskeletal:     ?   General: No swelling.  ?   Cervical back: Full passive range of motion without pain, normal range of motion and neck supple. No pain with movement, spinous process tenderness or muscular tenderness. Normal range of motion.  ?   Right lower leg: No edema.  ?   Left lower leg: No edema.  ?Skin: ?   General: Skin is warm and dry.  ?   Capillary Refill: Capillary refill takes less than 2 seconds.  ?   Findings: No ecchymosis, erythema, lesion or wound.  ?Neurological:  ?   Mental Status: He is alert and oriented to person, place, and time.  ?   GCS: GCS eye subscore is 4. GCS verbal subscore is 5. GCS motor subscore is 6.  ?   Cranial Nerves: Cranial nerves 2-12 are intact.  ?   Sensory:  Sensation is intact.  ?   Motor: Motor function is intact. No weakness or abnormal muscle tone.  ?   Coordination: Coordination is intact.  ?Psychiatric:     ?   Mood and Affect: Mood normal.     ?   Speech: Speech normal.     ?   Behavior: Behavior normal.  ? ? ?ED Results / Procedures / Treatments   ?Labs ?(all labs ordered are listed, but only abnormal results are displayed) ?Labs Reviewed  ?BASIC METABOLIC PANEL - Abnormal; Notable for the following components:  ?    Result Value  ? Glucose, Bld 118 (*)   ? All other components within normal limits  ?CBC  ?TROPONIN I (HIGH SENSITIVITY)  ?TROPONIN I (HIGH SENSITIVITY)  ? ? ?EKG ?EKG Interpretation ? ?Date/Time:  Saturday November 03 2021 21:17:27 EDT ?Ventricular Rate:  66 ?PR Interval:  138 ?QRS Duration: 116 ?QT Interval:  380 ?QTC Calculation: 398 ?R Axis:   27 ?Text Interpretation: Normal sinus rhythm with sinus arrhythmia EKG WITHIN NORMAL  LIMITS Confirmed by Gilda Crease [phone]) on 11/04/2021 12:38:53 AM ? ?Radiology ?DG Chest Port 1 View ? ?Result Date: 11/04/2021 ?CLINICAL DATA:  Epigastric pain EXAM: PORTABLE CHEST 1 VIEW COMPARISON:  02/01/2016 FINDINGS: Heart and mediastinal contours are within normal limits. No focal opacities or effusions. No acute bony abnormality. IMPRESSION: No active disease. Electronically Signed   By: Charlett Nose M.D.   On: 11/04/2021 00:10   ? ?Procedures ?Procedures  ? ? ?Medications Ordered in ED ?Medications  ?alum & mag hydroxide-simeth (MAALOX/MYLANTA) 200-200-20 MG/5ML suspension 30 mL (has no administration in time range)  ?  And  ?lidocaine (XYLOCAINE) 2 % viscous mouth solution 15 mL (has no administration in time range)  ?famotidine (PEPCID) tablet 40 mg (has no administration in time range)  ? ? ?ED Course/ Medical Decision Making/ A&P ?  ?                        ?Medical Decision Making ?Amount and/or Complexity of Data Reviewed ?Labs: ordered. ? ? ?Patient presents to the emergency department for evaluation of epigastric discomfort.  Patient concerned because he does have a family history of heart disease.  Patient does have several additional cardiac risk factors.  Symptoms, however, are atypical. ? ?Differential diagnosis includes unstable angina, acute coronary syndrome, gastritis, cholecystitis, peptic ulcer disease. ? ?Patient complaining of epigastric discomfort.  He does have tenderness in the epigastric region.  No right upper quadrant tenderness to suggest gallbladder disease.  Symptoms are atypical for cardiac etiology but he does have cardiac risk factors.  His cardiac evaluation, however, has been unremarkable.  No concerning findings on EKG, troponin negative x2.  Symptoms are more consistent with GI origin. ? ? ? ? ? ? ? ?Final Clinical Impression(s) / ED Diagnoses ?Final diagnoses:  ?Acute gastritis without hemorrhage, unspecified gastritis type  ? ? ?Rx / DC Orders ?ED Discharge  Orders   ? ?      Ordered  ?  pantoprazole (PROTONIX) 40 MG tablet  Daily       ? 11/04/21 0040  ?  sucralfate (CARAFATE) 1 GM/10ML suspension  3 times daily with meals & bedtime       ? 11/04/21 0040  ? ?  ?  ? ?  ? ? ?  ?Gilda Crease, MD ?11/04/21 0041 ? ?

## 2021-11-19 ENCOUNTER — Ambulatory Visit: Payer: 59 | Admitting: Rheumatology

## 2021-12-12 ENCOUNTER — Ambulatory Visit: Payer: Self-pay | Admitting: Rheumatology

## 2022-03-10 DIAGNOSIS — Z79899 Other long term (current) drug therapy: Secondary | ICD-10-CM | POA: Insufficient documentation

## 2022-03-10 DIAGNOSIS — R0789 Other chest pain: Secondary | ICD-10-CM | POA: Insufficient documentation

## 2022-03-10 DIAGNOSIS — I10 Essential (primary) hypertension: Secondary | ICD-10-CM | POA: Insufficient documentation

## 2022-03-10 DIAGNOSIS — D72829 Elevated white blood cell count, unspecified: Secondary | ICD-10-CM | POA: Insufficient documentation

## 2022-03-10 DIAGNOSIS — R002 Palpitations: Secondary | ICD-10-CM | POA: Insufficient documentation

## 2022-03-10 LAB — TROPONIN I (HIGH SENSITIVITY): Troponin I (High Sensitivity): 4 ng/L (ref <18)

## 2022-03-10 LAB — CBC
HCT: 45.6 % (ref 39.0–52.0)
Hemoglobin: 15.1 g/dL (ref 13.0–17.0)
MCH: 27.4 pg (ref 26.0–34.0)
MCHC: 33.1 g/dL (ref 30.0–36.0)
MCV: 82.8 fL (ref 80.0–100.0)
Platelets: 267 10*3/uL (ref 150–400)
RBC: 5.51 MIL/uL (ref 4.22–5.81)
RDW: 12.6 % (ref 11.5–15.5)
WBC: 11 10*3/uL — ABNORMAL HIGH (ref 4.0–10.5)
nRBC: 0 % (ref 0.0–0.2)

## 2022-03-10 LAB — BASIC METABOLIC PANEL
Anion gap: 12 (ref 5–15)
BUN: 14 mg/dL (ref 6–20)
CO2: 26 mmol/L (ref 22–32)
Calcium: 9.3 mg/dL (ref 8.9–10.3)
Chloride: 102 mmol/L (ref 98–111)
Creatinine, Ser: 0.93 mg/dL (ref 0.61–1.24)
GFR, Estimated: >60 mL/min (ref >60)
Glucose, Bld: 124 mg/dL — ABNORMAL HIGH (ref 70–99)
Potassium: 3.5 mmol/L (ref 3.5–5.1)
Sodium: 140 mmol/L (ref 135–145)

## 2022-03-10 NOTE — ED Triage Notes (Signed)
POV, amb, alert and oriented x 4  Pt c/o chest pain x weeks, last day or two pt c/o left arm pain and in neck. Denies diaphoresis, nausea, or other symptoms.  Denies cardiac hx but family hx of heart failure.

## 2022-03-11 ENCOUNTER — Emergency Department (HOSPITAL_BASED_OUTPATIENT_CLINIC_OR_DEPARTMENT_OTHER): Payer: 59

## 2022-03-11 ENCOUNTER — Emergency Department (HOSPITAL_BASED_OUTPATIENT_CLINIC_OR_DEPARTMENT_OTHER)
Admission: EM | Admit: 2022-03-11 | Discharge: 2022-03-11 | Disposition: A | Discharge status: Home / Self Care | Payer: 59 | Attending: Emergency Medicine | Admitting: Emergency Medicine

## 2022-03-11 DIAGNOSIS — R0789 Other chest pain: Secondary | ICD-10-CM

## 2022-03-11 LAB — TROPONIN I (HIGH SENSITIVITY): Troponin I (High Sensitivity): 3 ng/L (ref <18)

## 2022-03-11 MED ORDER — ALUM & MAG HYDROXIDE-SIMETH 200-200-20 MG/5ML PO SUSP
30.0000 mL | Freq: Once | ORAL | Status: AC
Start: 2022-03-11 — End: 2022-03-11
  Administered 2022-03-11: 30 mL via ORAL
  Filled 2022-03-11: qty 30

## 2022-03-11 MED ORDER — LIDOCAINE VISCOUS HCL 2 % MT SOLN
15.0000 mL | Freq: Once | OROMUCOSAL | Status: AC
  Administered 2022-03-11: 15 mL via OROMUCOSAL
  Filled 2022-03-11: qty 15

## 2022-03-11 NOTE — ED Provider Notes (Signed)
[facility] EMERGENCY DEPT Provider Note  CSN: [id number] Arrival date & time: 03/10/22 2207  Chief Complaint(s) Chest Pain  HPI Ross Best is a 36 y.o. male     Chest Pain Pain location:  Substernal area and L chest Pain quality: pressure, sharp and stabbing   Pain radiates to:  Neck and L shoulder Pain severity:  Mild Onset quality:  Gradual Duration: several weeks. Timing:  Constant Progression:  Waxing and waning Relieved by:  Nothing Worsened by:  Certain positions Associated symptoms: palpitations   Associated symptoms: no cough, no diaphoresis, no dizziness, no fatigue, no fever, no nausea, no shortness of breath and no vomiting   Risk factors: high cholesterol, hypertension and male sex   Risk factors: no diabetes mellitus     Past Medical History Past Medical History:  Diagnosis Date   Hypertension    Kidney stone    Patient Active Problem List   Diagnosis Date Noted   Family history of heart disease 05/10/2020   Hyperlipidemia 05/10/2020   Carpal tunnel syndrome 07/13/2018   Essential hypertension 07/24/2017   Arthralgia 07/21/2017   Intermittent palpitations 07/21/2017   Tobacco use 07/21/2017   Paresthesias in left hand 11/29/2015   Left wrist pain 11/24/2015   Cigarette nicotine dependence without complication 08/11/2015   Home Medication(s) Prior to Admission medications   Medication Sig Start Date End Date Taking? Authorizing Provider  escitalopram (LEXAPRO) 5 MG tablet Take 5 mg by mouth daily.    [provider]  losartan (COZAAR) 100 MG tablet Take 100 mg by mouth daily.    [provider]  pantoprazole (PROTONIX) 40 MG tablet Take 1 tablet (40 mg total) by mouth daily. 11/04/21   Gilda Crease, MD  sucralfate (CARAFATE) 1 GM/10ML suspension Take 10 mLs (1 g total) by mouth 4 (four) times daily -  with meals and at bedtime. 11/04/21   Gilda Crease, MD                                                                                                                                     Allergies Patient has no known allergies.  Review of Systems Review of Systems  Constitutional:  Negative for diaphoresis, fatigue and fever.  Respiratory:  Negative for cough and shortness of breath.   Cardiovascular:  Positive for chest pain and palpitations.  Gastrointestinal:  Negative for nausea and vomiting.  Neurological:  Negative for dizziness.   As noted in HPI  Physical Exam Vital Signs  I have reviewed the triage vital signs BP 130/83   Pulse (!) 56   Temp 98.2 F (36.8 C)   Resp 17   Ht 6\' 1"  (1.854 m)   Wt 129.3 kg   SpO2 96%   BMI 37.60 kg/m   Physical Exam Vitals reviewed.  Constitutional:      General: He is not in acute distress.  Appearance: He is well-developed. He is not diaphoretic.  HENT:     Head: Normocephalic and atraumatic.     Nose: Nose normal.  Eyes:     General: No scleral icterus.       Right eye: No discharge.        Left eye: No discharge.     Conjunctiva/sclera: Conjunctivae normal.     Pupils: Pupils are equal, round, and reactive to light.  Cardiovascular:     Rate and Rhythm: Normal rate and regular rhythm.     Heart sounds: No murmur heard.    No friction rub. No gallop.  Pulmonary:     Effort: Pulmonary effort is normal. No respiratory distress.     Breath sounds: Normal breath sounds. No stridor. No rales.  Abdominal:     General: There is no distension.     Palpations: Abdomen is soft.     Tenderness: There is no abdominal tenderness.  Musculoskeletal:        General: No tenderness.     Cervical back: Normal range of motion and neck supple.  Skin:    General: Skin is warm and dry.     Findings: No erythema or rash.  Neurological:     Mental Status: He is alert and oriented to person, place, and time.     ED Results and Treatments Labs (all labs ordered are listed, but only abnormal results are displayed) Labs Reviewed   BASIC METABOLIC PANEL - Abnormal; Notable for the following components:      Result Value   Glucose, Bld 124 (*)    All other components within normal limits  CBC - Abnormal; Notable for the following components:   WBC 11.0 (*)    All other components within normal limits  TROPONIN I (HIGH SENSITIVITY)  TROPONIN I (HIGH SENSITIVITY)                                                                                                                         EKG  EKG Interpretation  Date/Time:  Monday March 11 2022 01:07:47 EDT Ventricular Rate:  61 PR Interval:  138 QRS Duration: 116 QT Interval:  436 QTC Calculation: 440 R Axis:   37 Text Interpretation: Sinus rhythm Nonspecific intraventricular conduction delay Baseline wander in lead(s) II unchanged from last tracing. TWI in III Confirmed by Drema Pry [phone]) on 03/11/2022 1:43:16 AM       Radiology DG Chest Portable 1 View  Result Date: 03/11/2022 CLINICAL DATA:  Chest pain EXAM: PORTABLE CHEST 1 VIEW COMPARISON:  11/03/2021 FINDINGS: The heart size and mediastinal contours are within normal limits. Both lungs are clear. The visualized skeletal structures are unremarkable. IMPRESSION: Normal study. Electronically Signed   By: Charlett Nose M.D.   On: 03/11/2022 00:22    Pertinent labs & imaging results that were available during my care of the patient were reviewed by me and considered in my medical decision making (see MDM for details).  Medications Ordered in ED Medications  alum & mag hydroxide-simeth (MAALOX/MYLANTA) 200-200-20 MG/5ML suspension 30 mL (30 mLs Oral Given 03/11/22 0104)  lidocaine (XYLOCAINE) 2 % viscous mouth solution 15 mL (15 mLs Mouth/Throat Given 03/11/22 0104)                                                                                                                                     Procedures Procedures  (including critical care time)  Medical Decision Making / ED Course    Complexity of  Problem:  Co-morbidities/SDOH that complicate the patient evaluation/care: Hypertension, hyperlipidemia  Patient's presenting problem/concern, DDX, and MDM listed below: Several weeks of constant chest pain Unlikely ACS given the duration of the symptoms but will rule out EKG and delta tropes. Low suspicion for pulmonary embolism and patient is PERC negative Presentation is not classic for arctic dissection or esophageal perforation Possible MSK versus GI.    Complexity of Data:   Cardiac Monitoring: The patient was maintained on a cardiac monitor.   I personally viewed and interpreted the cardiac monitored which showed an underlying rhythm of normal sinus rhythm with rates in the 60s EKG with new T wave inversion in V3.  No other ischemic changes or reciprocal changes.  No evidence of pericarditis Repeat EKG unchanged  Laboratory Tests ordered listed below with my independent interpretation: CBC with mild leukocytosis.  No anemia. Metabolic panel without significant electrolyte derangements or renal sufficiency Initial troponin negative Second troponin negative.   Imaging Studies ordered listed below with my independent interpretation: On my read of the chest x-ray, there was no evidence suggestive of pneumonia, pneumothorax, pneumomediastinum, pulmonary edema concerning for new or exacerbation of heart failure, abnormal contour of the mediastinum to suggest dissection, and no evidence of acute injuries.      ED Course:    Assessment, Add'l Intervention, and Reassessment: Chest pain Atypical for ACS and had negative troponins. Patient does have new T wave inversion compared to April of this year.  It is nonspecific.  Recommended he follow-up with his cardiologist. Rest of the work-up reassuring without evidence of other serious etiologies. Pain improved slightly with GI cocktail.    Final Clinical Impression(s) / ED Diagnoses Final diagnoses:  Chest discomfort   The  patient appears reasonably screened and/or stabilized for discharge and I doubt any other medical condition or other [facility] requiring further screening, evaluation, or treatment in the ED at this time. I have discussed the findings, Dx and Tx plan with the patient/family who expressed understanding and agree(s) with the plan. Discharge instructions discussed at length. The patient/family was given strict return precautions who verbalized understanding of the instructions. No further questions at time of discharge.  Disposition: Discharge  Condition: Good  ED Discharge Orders     None       Follow Up: [facility] [address] [place] [address] [phone]  Call  to  schedule an appointment for close follow up  Cardiologist  Call  to schedule an appointment for close follow up           This chart was dictated using voice recognition software.  Despite best efforts to proofread,  errors can occur which can change the documentation meaning.    Nira Conn, MD 03/11/22 [phone]

## 2022-08-31 ENCOUNTER — Emergency Department (HOSPITAL_COMMUNITY)
Admission: EM | Admit: 2022-08-31 | Discharge: 2022-09-01 | Disposition: A | Discharge status: Home / Self Care | Payer: Self-pay | Attending: Emergency Medicine | Admitting: Emergency Medicine

## 2022-08-31 ENCOUNTER — Emergency Department (HOSPITAL_COMMUNITY): Payer: Self-pay

## 2022-08-31 DIAGNOSIS — I714 Abdominal aortic aneurysm, without rupture, unspecified: Secondary | ICD-10-CM | POA: Insufficient documentation

## 2022-08-31 DIAGNOSIS — K209 Esophagitis, unspecified without bleeding: Secondary | ICD-10-CM | POA: Insufficient documentation

## 2022-08-31 DIAGNOSIS — R079 Chest pain, unspecified: Secondary | ICD-10-CM

## 2022-08-31 LAB — CBC WITH DIFFERENTIAL/PLATELET
Abs Immature Granulocytes: 0.03 10*3/uL (ref 0.00–0.07)
Basophils Absolute: 0.1 10*3/uL (ref 0.0–0.1)
Basophils Relative: 1 %
Eosinophils Absolute: 0.3 10*3/uL (ref 0.0–0.5)
Eosinophils Relative: 4 %
HCT: 45.7 % (ref 39.0–52.0)
Hemoglobin: 14.7 g/dL (ref 13.0–17.0)
Immature Granulocytes: 0 %
Lymphocytes Relative: 20 %
Lymphs Abs: 1.8 10*3/uL (ref 0.7–4.0)
MCH: 27.4 pg (ref 26.0–34.0)
MCHC: 32.2 g/dL (ref 30.0–36.0)
MCV: 85.3 fL (ref 80.0–100.0)
Monocytes Absolute: 0.6 10*3/uL (ref 0.1–1.0)
Monocytes Relative: 6 %
Neutro Abs: 6.4 10*3/uL (ref 1.7–7.7)
Neutrophils Relative %: 69 %
Platelets: 265 10*3/uL (ref 150–400)
RBC: 5.36 MIL/uL (ref 4.22–5.81)
RDW: 12.2 % (ref 11.5–15.5)
WBC: 9.2 10*3/uL (ref 4.0–10.5)
nRBC: 0 % (ref 0.0–0.2)

## 2022-08-31 LAB — COMPREHENSIVE METABOLIC PANEL
ALT: 33 U/L (ref 0–44)
AST: 26 U/L (ref 15–41)
Albumin: 3.5 g/dL (ref 3.5–5.0)
Alkaline Phosphatase: 61 U/L (ref 38–126)
Anion gap: 8 (ref 5–15)
BUN: 9 mg/dL (ref 6–20)
CO2: 26 mmol/L (ref 22–32)
Calcium: 8.1 mg/dL — ABNORMAL LOW (ref 8.9–10.3)
Chloride: 103 mmol/L (ref 98–111)
Creatinine, Ser: 0.92 mg/dL (ref 0.61–1.24)
GFR, Estimated: >60 mL/min (ref >60)
Glucose, Bld: 122 mg/dL — ABNORMAL HIGH (ref 70–99)
Potassium: 3.5 mmol/L (ref 3.5–5.1)
Sodium: 137 mmol/L (ref 135–145)
Total Bilirubin: 0.4 mg/dL (ref 0.3–1.2)
Total Protein: 6.1 g/dL — ABNORMAL LOW (ref 6.5–8.1)

## 2022-08-31 LAB — TROPONIN I (HIGH SENSITIVITY): Troponin I (High Sensitivity): 3 ng/L (ref <18)

## 2022-08-31 NOTE — ED Provider Triage Note (Signed)
Emergency Medicine Provider Triage Evaluation Note  Ross Best , a 37 y.o. male  was evaluated in triage.  Pt complains of chest pain   Review of Systems  Positive: Pain in chest and arm Negative: fever  Physical Exam  BP (!) 134/90 (BP Location: Right Arm)   Pulse 75   Temp 98.7 F (37.1 C) (Oral)   Resp 16   SpO2 96%  Gen:   Awake, no distress   Resp:  Normal effort  MSK:   Moves extremities without difficulty  Other:    Medical Decision Making  Medically screening exam initiated at 7:13 PM.  Appropriate orders placed.  Rosalia Hammers was informed that the remainder of the evaluation will be completed by another provider, this initial triage assessment does not replace that evaluation, and the importance of remaining in the ED until their evaluation is complete.     Fransico Meadow, [place] 08/31/22 1914

## 2022-08-31 NOTE — ED Triage Notes (Signed)
Patient BIB [facility] from home for evaluation of sudden onset of chest pain that started earlier today. Pain radiating into left arm, initial BP 220/100 with EMS. Patient self-administered 2x SL NTG and 324mg  ASA prior to EMS arrival to home. EMS administered additional SL NTG with improvement in BP and pain. Patient is alert, oriented, and in no apparent distress at this time.  18g saline lock in left AC. BP 150/90

## 2022-09-01 ENCOUNTER — Emergency Department (HOSPITAL_COMMUNITY): Payer: Self-pay

## 2022-09-01 LAB — TROPONIN I (HIGH SENSITIVITY): Troponin I (High Sensitivity): 5 ng/L (ref <18)

## 2022-09-01 MED ORDER — PANTOPRAZOLE SODIUM 20 MG PO TBEC: 20.0000 mg | DELAYED_RELEASE_TABLET | Freq: Every day | ORAL | 0 refills | Status: AC

## 2022-09-01 MED ORDER — IOHEXOL 350 MG/ML SOLN
75.0000 mL | Freq: Once | INTRAVENOUS | Status: AC | PRN
  Administered 2022-09-01: 75 mL via INTRAVENOUS

## 2022-09-01 MED ORDER — FAMOTIDINE 20 MG PO TABS: 20.0000 mg | ORAL_TABLET | Freq: Two times a day (BID) | ORAL | 0 refills | Status: AC

## 2022-09-01 MED ORDER — ACETAMINOPHEN 500 MG PO TABS
1000.0000 mg | ORAL_TABLET | Freq: Four times a day (QID) | ORAL | Status: AC | PRN
  Administered 2022-09-01: 1000 mg via ORAL
  Filled 2022-09-01: qty 2

## 2022-09-02 NOTE — ED Provider Notes (Signed)
[facility] Provider Note   CSN: [id number] Arrival date & time: 08/31/22  1851     History Chief Complaint  Patient presents with   Chest Pain    Ross Best is a 37 y.o. male.  37 yo M here with chest pain. Pressure and sharp radiates up chest towards back, into neck and left arm. Has h/o chest pain with negative workups in the ED, negative stress tests and reportedly a negative catheterization. Some clamminess, nausea. No dyspnea, lightheadedness or other associated symptoms.no recent illnesses, cough or fever.    Chest Pain      Home Medications Prior to Admission medications   Medication Sig Start Date End Date Taking? Authorizing Provider  famotidine (PEPCID) 20 MG tablet Take 1 tablet (20 mg total) by mouth 2 (two) times daily. 09/01/22  Yes Marleah Beever, Corene Cornea, MD  pantoprazole (PROTONIX) 20 MG tablet Take 1 tablet (20 mg total) by mouth daily. 09/01/22  Yes Ruthy Forry, Corene Cornea, MD  escitalopram (LEXAPRO) 5 MG tablet Take 5 mg by mouth daily.    [provider]  losartan (COZAAR) 100 MG tablet Take 100 mg by mouth daily.    [provider]  sucralfate (CARAFATE) 1 GM/10ML suspension Take 10 mLs (1 g total) by mouth 4 (four) times daily -  with meals and at bedtime. 11/04/21   Orpah Greek, MD      Allergies    Patient has no known allergies.    Review of Systems   Review of Systems  Cardiovascular:  Positive for chest pain.    Physical Exam Updated Vital Signs BP (!) 149/98   Pulse 70   Temp 97.9 F (36.6 C) (Oral)   Resp 18   SpO2 97%  Physical Exam Vitals and nursing note reviewed.  Constitutional:      Appearance: He is well-developed.  HENT:     Head: Normocephalic and atraumatic.  Cardiovascular:     Rate and Rhythm: Normal rate.  Pulmonary:     Effort: Pulmonary effort is normal. No respiratory distress.  Abdominal:     General: There is no distension.  Musculoskeletal:         General: Normal range of motion.     Cervical back: Normal range of motion.  Neurological:     Mental Status: He is alert.     ED Results / Procedures / Treatments   Labs (all labs ordered are listed, but only abnormal results are displayed) Labs Reviewed  COMPREHENSIVE METABOLIC PANEL - Abnormal; Notable for the following components:      Result Value   Glucose, Bld 122 (*)    Calcium 8.1 (*)    Total Protein 6.1 (*)    All other components within normal limits  CBC WITH DIFFERENTIAL/PLATELET  TROPONIN I (HIGH SENSITIVITY)  TROPONIN I (HIGH SENSITIVITY)    EKG EKG Interpretation  Date/Time:  Saturday August 31 2022 19:04:36 EST Ventricular Rate:  75 PR Interval:  134 QRS Duration: 104 QT Interval:  386 QTC Calculation: 431 R Axis:   40 Text Interpretation: Normal sinus rhythm Nonspecific T wave abnormality Abnormal ECG When compared with ECG of 11-Mar-2022 01:07, PREVIOUS ECG IS PRESENT Confirmed by Merrily Pew [phone]) on 09/01/2022 3:27:40 AM  Radiology CT Angio Chest/Abd/Pel for Dissection W and/or Wo Contrast  Result Date: 09/01/2022 CLINICAL DATA:  Acute aortic syndrome suspected. Sudden onset radiating chest pain. EXAM: CT ANGIOGRAPHY CHEST, ABDOMEN AND PELVIS TECHNIQUE: Non-contrast CT of the chest  was initially obtained. Multidetector CT imaging through the chest, abdomen and pelvis was performed using the standard protocol during bolus administration of intravenous contrast. Multiplanar reconstructed images and MIPs were obtained and reviewed to evaluate the vascular anatomy. RADIATION DOSE REDUCTION: This exam was performed according to the departmental dose-optimization program which includes automated exposure control, adjustment of the mA and/or kV according to patient size and/or use of iterative reconstruction technique. CONTRAST:  56mL OMNIPAQUE IOHEXOL 350 MG/ML SOLN COMPARISON:  Portable chest yesterday, portable chest 03/11/2022, and CT of the abdomen and  pelvis without contrast 12/14/2009. No prior chest CT. FINDINGS: CTA CHEST FINDINGS Cardiovascular: No aortic hematoma on the noncontrast chest CT. The cardiac size is normal. There is no increased RV/LV ratio, but there is noted IVC and hepatic vein reflux. No aortic or coronary artery calcifications there are seen. There is a mildly ectatic aortic root at the sinuses of Valsalva where it measures 3.7 cm. Remaining thoracic aorta is within normal caliber limits. There is no stenosis, aneurysm or dissection the great vessels are normal and branch normally. Pulmonary arteries and veins are normal in caliber and no arterial embolus is seen. Mediastinum/Nodes: There is mild thickening of the distal thoracic esophagus most likely from esophagitis and most likely reflux related. No masslike thickening is seen. There is no intrathoracic or axillary adenopathy. The thoracic trachea is unremarkable. The lower poles of the thyroid are unremarkable. Lungs/Pleura: Mild elevation right hemidiaphragm. There is diffuse bronchial thickening. The lungs are clear. There is no pleural effusion, thickening or pneumothorax. Musculoskeletal: The thoracic spine discs demonstrate degenerative features there is mild thoracic spondylosis. No concerning regional bone lesions. No chest wall mass. The ribcage is intact. Review of the MIP images confirms the above findings. CTA ABDOMEN AND PELVIS FINDINGS VASCULAR Aorta: There are minimal scattered calcific plaques. There is no aneurysm, stenosis, dissection or penetrating ulcer. Of incidental note, there is a normal variant aortic origin for the left gastric artery, widely patent and arising just above the takeoff of the celiac axis. Celiac: Normal. SMA: Normal. Renals: 2 arteries supply the left kidney and 1 artery supplies the right. All 3 arteries are widely patent. IMA: Normal. Inflow: There are mild calcifications in the common iliac arteries in the proximal left internal iliac artery,  but no flow-limiting stenosis, dissection or aneurysm in the common iliac, internal and external iliac arteries bilaterally and no other appreciable inflow vessel plaques. The visualized outflow arteries are normal. Veins: Unopacified and not evaluated, except for the upper hepatic IVC and proximal hepatic veins which are patent. Review of the MIP images confirms the above findings. NON-VASCULAR Hepatobiliary: 18.5 cm in length liver with mild steatosis. No mass enhancement. 8 mm stone in the gallbladder neck is seen but no gallbladder dilatation, wall thickening or biliary dilatation. Pancreas: No abnormality. Spleen: Mildly prominent, 14.4 cm AP, unchanged.  No mass. Adrenals/Urinary Tract: No adrenal or renal cortical mass. There is a homogeneous 1.5 cm cyst in the upper pole of the left kidney, Hounsfield density of -1.7. No follow-up imaging is recommended. There is a 3 mm nonobstructive caliceal stone in the upper pole of the right kidney. No left nephrolithiasis is seen. There is no hydronephrosis or ureteral stone. There is no bladder thickening. Stomach/Bowel: No dilatation or wall thickening including the appendix. There is diverticulosis without evidence of diverticulitis. Lymphatic: No adenopathy is seen. Reproductive: Normal prostate size and configuration. Other: No abdominal wall hernia or abnormality. No abdominopelvic ascites. Musculoskeletal: No acute or significant  osseous findings. At L4-5 there is concern for a left paracentral herniated disc with compression of the descending left L5 nerve root. MRI may be indicated. Clinical correlation advised. Review of the MIP images confirms the above findings. IMPRESSION: 1. No aortic aneurysm, dissection or penetrating ulcer. 2. Mild abdominal aortic and iliac artery atherosclerosis. 3. IVC and hepatic vein reflux, nonspecific but can be seen with right heart dysfunction or tricuspid regurgitation, as well as with a rapid contrast injection. The  pulmonary arteries are normal caliber. 4. Bronchitis without evidence of pneumonia. 5. Mild thickening in the distal thoracic esophagus most likely due to esophagitis and most likely reflux related. No masslike thickening. 6. Mild hepatic steatosis. 7. Cholelithiasis without evidence of cholecystitis. 8. Mild splenomegaly. 9. Nonobstructive nephrolithiasis. 10. Diverticulosis without evidence of diverticulitis. 11. Suspected left paracentral L4-5 herniated disc with compression of the descending left L5 nerve root. MRI may be indicated. Electronically Signed   By: Almira Bar M.D.   On: 09/01/2022 06:08   DG Chest 1 View  Result Date: 08/31/2022 CLINICAL DATA:  Chest pain. Left-sided chest and arm pain. EXAM: CHEST  1 VIEW COMPARISON:  03/11/2022 FINDINGS: The cardiomediastinal contours are normal. The lungs are clear. Pulmonary vasculature is normal. No consolidation, pleural effusion, or pneumothorax. No acute osseous abnormalities are seen. IMPRESSION: No acute chest findings. Electronically Signed   By: Narda Rutherford M.D.   On: 08/31/2022 19:44    Procedures Procedures    Medications Ordered in ED Medications  acetaminophen (TYLENOL) tablet 1,000 mg (1,000 mg Oral Given 09/01/22 0200)  iohexol (OMNIPAQUE) 350 MG/ML injection 75 mL (75 mLs Intravenous Contrast Given 09/01/22 0419)    ED Course/ Medical Decision Making/ A&P                             Medical Decision Making Amount and/or Complexity of Data Reviewed Radiology: ordered.  Risk OTC drugs. Prescription drug management.   Ecg unnchaged. Negative troponins - - multiple cardiac workups in past are negative. Doubt ACS at this point.  Disection study negative, doubt PE or aortic pathology.  Ct does show e/o bronchitis but no sob, cough or fever, smoking to suggest it as the cause.  Could be coronoary vasospasm? Esophageal spasm? MSK? Will fu w/ pcp/cardiologist if continues.   CTA done and showed no obvious dissection,  aneurysm, PE or consolidative pneumonia (independently viewed and interpreted by myself and radiology read reviewed). CXR done and showed no obvious consolidation or PTX (independently viewed and interpreted by myself and radiology read reviewed).   Final Clinical Impression(s) / ED Diagnoses Final diagnoses:  Nonspecific chest pain  Esophagitis    Rx / DC Orders ED Discharge Orders          Ordered    pantoprazole (PROTONIX) 20 MG tablet  Daily        09/01/22 0704    famotidine (PEPCID) 20 MG tablet  2 times daily        09/01/22 0704              Eleanora Guinyard, Barbara Cower, MD 09/02/22 [phone]

## 2023-09-30 ENCOUNTER — Other Ambulatory Visit: Payer: Self-pay

## 2023-09-30 ENCOUNTER — Emergency Department (HOSPITAL_BASED_OUTPATIENT_CLINIC_OR_DEPARTMENT_OTHER)
Admission: EM | Admit: 2023-09-30 | Discharge: 2023-09-30 | Disposition: A | Discharge status: Home / Self Care | Payer: Self-pay | Attending: Emergency Medicine | Admitting: Emergency Medicine

## 2023-09-30 ENCOUNTER — Encounter (HOSPITAL_BASED_OUTPATIENT_CLINIC_OR_DEPARTMENT_OTHER): Payer: Self-pay

## 2023-09-30 ENCOUNTER — Emergency Department (HOSPITAL_BASED_OUTPATIENT_CLINIC_OR_DEPARTMENT_OTHER): Payer: Self-pay

## 2023-09-30 DIAGNOSIS — R197 Diarrhea, unspecified: Secondary | ICD-10-CM | POA: Insufficient documentation

## 2023-09-30 DIAGNOSIS — R1032 Left lower quadrant pain: Secondary | ICD-10-CM | POA: Insufficient documentation

## 2023-09-30 LAB — URINALYSIS, ROUTINE W REFLEX MICROSCOPIC
Bilirubin Urine: NEGATIVE
Glucose, UA: NEGATIVE mg/dL
Hgb urine dipstick: NEGATIVE
Ketones, ur: 15 mg/dL — AB
Leukocytes,Ua: NEGATIVE
Nitrite: NEGATIVE
Protein, ur: NEGATIVE mg/dL
Specific Gravity, Urine: 1.005 (ref 1.005–1.030)
pH: 6.5 (ref 5.0–8.0)

## 2023-09-30 LAB — CBC
HCT: 53.2 % — ABNORMAL HIGH (ref 39.0–52.0)
Hemoglobin: 17.8 g/dL — ABNORMAL HIGH (ref 13.0–17.0)
MCH: 26.8 pg (ref 26.0–34.0)
MCHC: 33.5 g/dL (ref 30.0–36.0)
MCV: 80.1 fL (ref 80.0–100.0)
Platelets: 274 10*3/uL (ref 150–400)
RBC: 6.64 MIL/uL — ABNORMAL HIGH (ref 4.22–5.81)
RDW: 12.1 % (ref 11.5–15.5)
WBC: 7.5 10*3/uL (ref 4.0–10.5)
nRBC: 0 % (ref 0.0–0.2)

## 2023-09-30 LAB — COMPREHENSIVE METABOLIC PANEL
ALT: 55 U/L — ABNORMAL HIGH (ref 0–44)
AST: 43 U/L — ABNORMAL HIGH (ref 15–41)
Albumin: 4.9 g/dL (ref 3.5–5.0)
Alkaline Phosphatase: 68 U/L (ref 38–126)
Anion gap: 9 (ref 5–15)
BUN: 13 mg/dL (ref 6–20)
CO2: 29 mmol/L (ref 22–32)
Calcium: 9.6 mg/dL (ref 8.9–10.3)
Chloride: 101 mmol/L (ref 98–111)
Creatinine, Ser: 0.97 mg/dL (ref 0.61–1.24)
GFR, Estimated: >60 mL/min (ref >60)
Glucose, Bld: 115 mg/dL — ABNORMAL HIGH (ref 70–99)
Potassium: 3.4 mmol/L — ABNORMAL LOW (ref 3.5–5.1)
Sodium: 139 mmol/L (ref 135–145)
Total Bilirubin: 1.3 mg/dL — ABNORMAL HIGH (ref 0.0–1.2)
Total Protein: 7.9 g/dL (ref 6.5–8.1)

## 2023-09-30 LAB — RESP PANEL BY RT-PCR (RSV, FLU A&B, COVID)  RVPGX2
Influenza A by PCR: NEGATIVE
Influenza B by PCR: NEGATIVE
Resp Syncytial Virus by PCR: NEGATIVE
SARS Coronavirus 2 by RT PCR: NEGATIVE

## 2023-09-30 LAB — LIPASE, BLOOD: Lipase: 17 U/L (ref 11–51)

## 2023-09-30 MED ORDER — ONDANSETRON 4 MG PO TBDP: 4.0000 mg | ORAL_TABLET | Freq: Three times a day (TID) | ORAL | 0 refills | Status: AC | PRN

## 2023-09-30 MED ORDER — AMOXICILLIN-POT CLAVULANATE 875-125 MG PO TABS: 1.0000 | ORAL_TABLET | Freq: Two times a day (BID) | ORAL | 0 refills | Status: AC

## 2023-09-30 MED ORDER — IOHEXOL 300 MG/ML  SOLN
100.0000 mL | Freq: Once | INTRAMUSCULAR | Status: AC | PRN
  Administered 2023-09-30: 100 mL via INTRAVENOUS

## 2023-09-30 MED ORDER — ONDANSETRON HCL 4 MG/2ML IJ SOLN
4.0000 mg | Freq: Once | INTRAMUSCULAR | Status: AC
  Administered 2023-09-30: 4 mg via INTRAVENOUS
  Filled 2023-09-30: qty 2

## 2023-09-30 MED ORDER — SODIUM CHLORIDE 0.9 % IV BOLUS
1000.0000 mL | Freq: Once | INTRAVENOUS | Status: AC
  Administered 2023-09-30: 1000 mL via INTRAVENOUS

## 2023-09-30 NOTE — ED Provider Notes (Signed)
 [place] EMERGENCY DEPARTMENT AT [facility] Provider Note   CSN: [id number] Arrival date & time: 09/30/23  4098     History  Chief Complaint  Patient presents with   Abdominal Pain    Ross Best is a 38 y.o. male.   Abdominal Pain   38 year old male presents emergency department with complaints of abdominal pain, diarrhea.  Patient states that symptoms began this past Thursday with left lower abdominal pain with pronounced diarrhea.  States that symptoms got better day by day over the weekend and reaching resolution by yesterday evening.  States he woke up this morning again with left lower quadrant abdominal pain and returning of diarrhea prompting visit to the emergency department.  Denies any fever, chills, emesis but has reported nausea.  Denies any urinary symptoms, hematochezia/melena.  Denies any known sick contact.  Denies any recent international travel, hiking/camping, antibiotic use.  Past medical history significant for hypertension, kidney stone, hyperlipidemia  Home Medications Prior to Admission medications   Medication Sig Start Date End Date Taking? Authorizing Provider  amoxicillin-clavulanate (AUGMENTIN) 875-125 MG tablet Take 1 tablet by mouth every 12 (twelve) hours. 09/30/23  Yes Sherian Maroon A, PA  ondansetron (ZOFRAN-ODT) 4 MG disintegrating tablet Take 1 tablet (4 mg total) by mouth every 8 (eight) hours as needed. 09/30/23  Yes Sherian Maroon A, PA  escitalopram (LEXAPRO) 5 MG tablet Take 5 mg by mouth daily.    [provider]  famotidine (PEPCID) 20 MG tablet Take 1 tablet (20 mg total) by mouth 2 (two) times daily. 09/01/22   Mesner, Barbara Cower, MD  losartan (COZAAR) 100 MG tablet Take 100 mg by mouth daily.    [provider]  pantoprazole (PROTONIX) 20 MG tablet Take 1 tablet (20 mg total) by mouth daily. 09/01/22   Mesner, Barbara Cower, MD  sucralfate (CARAFATE) 1 GM/10ML suspension Take 10 mLs (1 g total) by mouth 4 (four)  times daily -  with meals and at bedtime. 11/04/21   Gilda Crease, MD      Allergies    Patient has no known allergies.    Review of Systems   Review of Systems  Gastrointestinal:  Positive for abdominal pain.  All other systems reviewed and are negative.   Physical Exam Updated Vital Signs BP (!) 134/98 (BP Location: Left Arm)   Pulse 65   Temp 98.6 F (37 C) (Oral)   Resp 16   Ht 6\' 1"  (1.854 m)   Wt 127 kg   SpO2 97%   BMI 36.94 kg/m  Physical Exam Vitals and nursing note reviewed.  Constitutional:      General: He is not in acute distress.    Appearance: He is well-developed.  HENT:     Head: Normocephalic and atraumatic.  Eyes:     Conjunctiva/sclera: Conjunctivae normal.  Cardiovascular:     Rate and Rhythm: Normal rate and regular rhythm.     Heart sounds: No murmur heard. Pulmonary:     Effort: Pulmonary effort is normal. No respiratory distress.     Breath sounds: Normal breath sounds.  Abdominal:     Palpations: Abdomen is soft.     Tenderness: There is abdominal tenderness in the left lower quadrant. There is no right CVA tenderness, left CVA tenderness, guarding or rebound.  Musculoskeletal:        General: No swelling.     Cervical back: Neck supple.  Skin:    General: Skin is warm and dry.  Capillary Refill: Capillary refill takes less than 2 seconds.  Neurological:     Mental Status: He is alert.  Psychiatric:        Mood and Affect: Mood normal.     ED Results / Procedures / Treatments   Labs (all labs ordered are listed, but only abnormal results are displayed) Labs Reviewed  COMPREHENSIVE METABOLIC PANEL - Abnormal; Notable for the following components:      Result Value   Potassium 3.4 (*)    Glucose, Bld 115 (*)    AST 43 (*)    ALT 55 (*)    Total Bilirubin 1.3 (*)    All other components within normal limits  CBC - Abnormal; Notable for the following components:   RBC 6.64 (*)    Hemoglobin 17.8 (*)    HCT 53.2 (*)     All other components within normal limits  URINALYSIS, ROUTINE W REFLEX MICROSCOPIC - Abnormal; Notable for the following components:   Color, Urine COLORLESS (*)    Ketones, ur 15 (*)    All other components within normal limits  RESP PANEL BY RT-PCR (RSV, FLU A&B, COVID)  RVPGX2  LIPASE, BLOOD    EKG None  Radiology CT ABDOMEN PELVIS W CONTRAST Result Date: 09/30/2023 CLINICAL DATA:  LLQ abdominal pain.  Epigastric pain. EXAM: CT ABDOMEN AND PELVIS WITH CONTRAST TECHNIQUE: Multidetector CT imaging of the abdomen and pelvis was performed using the standard protocol following bolus administration of intravenous contrast. RADIATION DOSE REDUCTION: This exam was performed according to the departmental dose-optimization program which includes automated exposure control, adjustment of the mA and/or kV according to patient size and/or use of iterative reconstruction technique. CONTRAST:  OMNIPAQUE IOHEXOL 300 MG/ML  SOLN COMPARISON:  CT angiography chest, abdomen and pelvis from 09/01/2022. FINDINGS: Lower chest: The lung bases are clear. No pleural effusion. The heart is normal in size. No pericardial effusion. Hepatobiliary: The liver is normal in size. Non-cirrhotic configuration. No suspicious mass. No intrahepatic or extrahepatic bile duct dilation. There is a single sub 3 mm calcified gallstone without imaging signs of acute cholecystitis. Normal gallbladder wall thickness. No pericholecystic inflammatory changes. Pancreas: Unremarkable. No pancreatic ductal dilatation or surrounding inflammatory changes. Spleen: Within normal limits. No focal lesion. Adrenals/Urinary Tract: Adrenal glands are unremarkable. No suspicious renal mass. There is an approximately 1 cm sized simple cyst in the right kidney lower pole, medially. There is a single 3 mm nonobstructing calculus in the right kidney. No other nephroureterolithiasis or obstructive uropathy on either side. Unremarkable urinary bladder.  Stomach/Bowel: No disproportionate dilation of the small or large bowel loops. No evidence of abnormal bowel wall thickening or inflammatory changes. The appendix is unremarkable. Vascular/Lymphatic: No ascites or pneumoperitoneum. No abdominal or pelvic lymphadenopathy, by size criteria. No aneurysmal dilation of the major abdominal arteries. There are mild peripheral atherosclerotic vascular calcifications of the aorta and its major branches. Reproductive: Normal size prostate. Symmetric seminal vesicles. Other: There is a tiny fat containing umbilical hernia. The soft tissues and abdominal wall are otherwise unremarkable. Musculoskeletal: No suspicious osseous lesions. IMPRESSION: 1. No acute inflammatory process identified within the abdomen or pelvis. 2. There is a 3 mm nonobstructing calculus in the right kidney. No other nephroureterolithiasis or obstructive uropathy on either side. 3. Multiple other nonacute observations, as described above. Aortic Atherosclerosis (ICD10-I70.0). Electronically Signed   By: Jules Schick M.D.   On: 09/30/2023 12:02    Procedures Procedures    Medications Ordered in ED Medications  ondansetron [facility]) injection 4 mg (4 mg Intravenous Given 09/30/23 0959)  sodium chloride 0.9 % bolus 1,000 mL (0 mLs Intravenous Stopped 09/30/23 1214)  iohexol (OMNIPAQUE) 300 MG/ML solution 100 mL (100 mLs Intravenous Contrast Given 09/30/23 1056)    ED Course/ Medical Decision Making/ A&P                                 Medical Decision Making Amount and/or Complexity of Data Reviewed Labs: ordered. Radiology: ordered.  Risk Prescription drug management.   This patient presents to the ED for concern of abdominal pain, this involves an extensive number of treatment options, and is a complaint that carries with it a high risk of complications and morbidity.  The differential diagnosis includes diverticulitis, appendicitis, SBO/LBO, volvulus, pyelonephritis,  nephrolithiasis, cystitis, CBD pathology, cholecystitis, gastritis, PUD other   Co morbidities that complicate the patient evaluation  See HPI   Additional history obtained:  Additional history obtained from EMR External records from outside source obtained and reviewed including hospital records   Lab Tests:  I Ordered, and personally interpreted labs.  The pertinent results include: No leukocytosis.  Polycythemia hemoglobin of 17.8.  Platelets within normal range.  Mild hypokalemia of 3.4, otherwise electrolytes within normal limits.  Mild transaminitis AST of 43, ALT 55.  No renal dysfunction.   Imaging Studies ordered:  I ordered imaging studies including CT abdomen pelvis I independently visualized and interpreted imaging which showed no acute inflammatory process.  3 mm nonobstructing calculus right kidney.  Aortic atherosclerosis. I agree with the radiologist interpretation  Cardiac Monitoring: / EKG:  The patient was maintained on a cardiac monitor.  I personally viewed and interpreted the cardiac monitored which showed an underlying rhythm of: Sinus rhythm   Consultations Obtained:  N/a   Problem List / ED Course / Critical interventions / Medication management  Left lower quadrant abdominal pain I ordered medication including 1 L normal saline, Zofran   Reevaluation of the patient after these medicines showed that the patient improved I have reviewed the patients home medicines and have made adjustments as needed   Social Determinants of Health:  Cigarette use.  Denies illicit drug use.   Test / Admission - Considered:  Left lower quadrant abdominal pain Vitals signs significant for hypertension, pressure 154 systolic. Otherwise within normal range and stable throughout visit. Laboratory/imaging studies significant for: See above 38 year old male presents emergency department complaints of left lower abdominal pain.  Pain present Thursday which  subsequently got better up until returning this morning.  On exam, tenderness left lower quadrant.  Labs reassuring for any acute emergent process.  CT imaging obtained which did not show evidence of any acute abnormality as cause of patient's left lower quadrant abdominal pain.  Have some suspicion that patient's symptoms could be secondary to early diverticulitis given history of diverticulosis with setting of focal tenderness in left lower quadrant of abdomen.  Will recommend continued dietary changes but will send in empiric antibiotics if symptoms do not improve over the next day or 2.  Treatment plan discussed at length with patient and he acknowledged understanding was agreeable to said plan.  Patient will well-appearing, afebrile in no acute distress. Worrisome signs and symptoms were discussed with the patient, and the patient acknowledged understanding to return to the ED if noticed. Patient was stable upon discharge.          Final Clinical Impression(s) / ED  Diagnoses Final diagnoses:  LLQ abdominal pain    Rx / DC Orders ED Discharge Orders          Ordered    ondansetron (ZOFRAN-ODT) 4 MG disintegrating tablet  Every 8 hours PRN        09/30/23 1214    amoxicillin-clavulanate (AUGMENTIN) 875-125 MG tablet  Every 12 hours        09/30/23 1214              Peter Garter, [place] 09/30/23 1522    Margarita Grizzle, MD 10/11/23 1534

## 2023-09-30 NOTE — Discharge Instructions (Addendum)
 As discussed, your workup today was overall reassuring.  You did have some nonobstructing kidney stones on the left side.  He should not be causing problems currently.  The CT scan did not show evidence of anything else as cause of your symptoms.  Given your history of diverticulosis as well as where the pain is located, concern for early flareup of diverticulitis.  Will try antibiotics in the outpatient setting for treatment of these symptoms.  Will also send with nausea medicine to use as needed.  Recommend follow-up with primary care for reassessment.  Please do not hesitate to return to emergency department the worrisome signs and symptoms we discussed become apparent.

## 2023-10-23 ENCOUNTER — Encounter (HOSPITAL_BASED_OUTPATIENT_CLINIC_OR_DEPARTMENT_OTHER): Payer: Self-pay | Admitting: Emergency Medicine

## 2023-10-23 ENCOUNTER — Other Ambulatory Visit: Payer: Self-pay

## 2023-10-23 ENCOUNTER — Emergency Department (HOSPITAL_BASED_OUTPATIENT_CLINIC_OR_DEPARTMENT_OTHER): Payer: Self-pay

## 2023-10-23 ENCOUNTER — Emergency Department (HOSPITAL_BASED_OUTPATIENT_CLINIC_OR_DEPARTMENT_OTHER)
Admission: EM | Admit: 2023-10-23 | Discharge: 2023-10-23 | Disposition: A | Discharge status: Home / Self Care | Payer: Self-pay | Attending: Emergency Medicine | Admitting: Emergency Medicine

## 2023-10-23 DIAGNOSIS — Z79899 Other long term (current) drug therapy: Secondary | ICD-10-CM | POA: Insufficient documentation

## 2023-10-23 DIAGNOSIS — K8050 Calculus of bile duct without cholangitis or cholecystitis without obstruction: Secondary | ICD-10-CM | POA: Insufficient documentation

## 2023-10-23 DIAGNOSIS — I10 Essential (primary) hypertension: Secondary | ICD-10-CM | POA: Insufficient documentation

## 2023-10-23 DIAGNOSIS — R1084 Generalized abdominal pain: Secondary | ICD-10-CM

## 2023-10-23 LAB — URINALYSIS, ROUTINE W REFLEX MICROSCOPIC
Bilirubin Urine: NEGATIVE
Glucose, UA: NEGATIVE mg/dL
Ketones, ur: 40 mg/dL — AB
Leukocytes,Ua: NEGATIVE
Nitrite: NEGATIVE
Protein, ur: NEGATIVE mg/dL
Specific Gravity, Urine: 1.011 (ref 1.005–1.030)
pH: 7 (ref 5.0–8.0)

## 2023-10-23 LAB — COMPREHENSIVE METABOLIC PANEL
ALT: 27 U/L (ref 0–44)
AST: 18 U/L (ref 15–41)
Albumin: 4.6 g/dL (ref 3.5–5.0)
Alkaline Phosphatase: 63 U/L (ref 38–126)
Anion gap: 7 (ref 5–15)
BUN: 9 mg/dL (ref 6–20)
CO2: 28 mmol/L (ref 22–32)
Calcium: 8.9 mg/dL (ref 8.9–10.3)
Chloride: 103 mmol/L (ref 98–111)
Creatinine, Ser: 0.81 mg/dL (ref 0.61–1.24)
GFR, Estimated: >60 mL/min (ref >60)
Glucose, Bld: 95 mg/dL (ref 70–99)
Potassium: 4 mmol/L (ref 3.5–5.1)
Sodium: 138 mmol/L (ref 135–145)
Total Bilirubin: 0.9 mg/dL (ref 0.0–1.2)
Total Protein: 6.9 g/dL (ref 6.5–8.1)

## 2023-10-23 LAB — CBC WITH DIFFERENTIAL/PLATELET
Abs Immature Granulocytes: 0.01 10*3/uL (ref 0.00–0.07)
Basophils Absolute: 0 10*3/uL (ref 0.0–0.1)
Basophils Relative: 0 %
Eosinophils Absolute: 0.1 10*3/uL (ref 0.0–0.5)
Eosinophils Relative: 1 %
HCT: 48.1 % (ref 39.0–52.0)
Hemoglobin: 16.2 g/dL (ref 13.0–17.0)
Immature Granulocytes: 0 %
Lymphocytes Relative: 20 %
Lymphs Abs: 1.6 10*3/uL (ref 0.7–4.0)
MCH: 27.6 pg (ref 26.0–34.0)
MCHC: 33.7 g/dL (ref 30.0–36.0)
MCV: 82.1 fL (ref 80.0–100.0)
Monocytes Absolute: 0.5 10*3/uL (ref 0.1–1.0)
Monocytes Relative: 7 %
Neutro Abs: 5.6 10*3/uL (ref 1.7–7.7)
Neutrophils Relative %: 72 %
Platelets: 262 10*3/uL (ref 150–400)
RBC: 5.86 MIL/uL — ABNORMAL HIGH (ref 4.22–5.81)
RDW: 12.7 % (ref 11.5–15.5)
WBC: 7.7 10*3/uL (ref 4.0–10.5)
nRBC: 0 % (ref 0.0–0.2)

## 2023-10-23 LAB — LIPASE, BLOOD: Lipase: 16 U/L (ref 11–51)

## 2023-10-23 MED ORDER — KETOROLAC TROMETHAMINE 15 MG/ML IJ SOLN
15.0000 mg | Freq: Once | INTRAMUSCULAR | Status: AC
  Administered 2023-10-23: 15 mg via INTRAVENOUS
  Filled 2023-10-23: qty 1

## 2023-10-23 NOTE — Discharge Instructions (Addendum)
 As we discussed, your ultrasound did show you had some sludge in the gallbladder which is likely causing some of the pain due to what is called biliary colic.  I have given you follow-up with general surgery.  If this becomes a more frequent issue when I would suggest following up with them.  You can take 600 mg of ibuprofen every 6 hours as needed for pain.  You can start taking this tomorrow.  You may return to the emergency department for any worsening symptoms.

## 2023-10-23 NOTE — ED Provider Notes (Signed)
 [place] EMERGENCY DEPARTMENT AT [facility] Provider Note   CSN: [id number] Arrival date & time: 10/23/23  1353     History Chief Complaint  Patient presents with   Abdominal Pain    Ross Best is a 38 y.o. male patient with history of hypertension and nephrolithiasis who presents to the emergency department today with right-sided flank pain that radiates into the right upper quadrant.  This started this morning.  He denies any nausea, vomiting, diarrhea, fever, chills.  Patient states this feels different from when he had his kidney stone.  He denies any urinary symptoms.   Abdominal Pain      Home Medications Prior to Admission medications   Medication Sig Start Date End Date Taking? Authorizing Provider  amoxicillin-clavulanate (AUGMENTIN) 875-125 MG tablet Take 1 tablet by mouth every 12 (twelve) hours. 09/30/23   Sherian Maroon A, PA  escitalopram (LEXAPRO) 5 MG tablet Take 5 mg by mouth daily.    [provider]  famotidine (PEPCID) 20 MG tablet Take 1 tablet (20 mg total) by mouth 2 (two) times daily. 09/01/22   Mesner, Barbara Cower, MD  losartan (COZAAR) 100 MG tablet Take 100 mg by mouth daily.    [provider]  ondansetron (ZOFRAN-ODT) 4 MG disintegrating tablet Take 1 tablet (4 mg total) by mouth every 8 (eight) hours as needed. 09/30/23   Peter Garter, PA  pantoprazole (PROTONIX) 20 MG tablet Take 1 tablet (20 mg total) by mouth daily. 09/01/22   Mesner, Barbara Cower, MD  sucralfate (CARAFATE) 1 GM/10ML suspension Take 10 mLs (1 g total) by mouth 4 (four) times daily -  with meals and at bedtime. 11/04/21   Gilda Crease, MD      Allergies    Patient has no known allergies.    Review of Systems   Review of Systems  Gastrointestinal:  Positive for abdominal pain.  All other systems reviewed and are negative.   Physical Exam Updated Vital Signs BP (!) 153/93   Pulse 73   Temp 98.4 F (36.9 C) (Oral)   Resp 16   Ht 6\' 1"   (1.854 m)   Wt 114.3 kg   SpO2 99%   BMI 33.25 kg/m  Physical Exam Vitals and nursing note reviewed.  Constitutional:      General: He is not in acute distress.    Appearance: Normal appearance.  HENT:     Head: Normocephalic and atraumatic.  Eyes:     General:        Right eye: No discharge.        Left eye: No discharge.  Cardiovascular:     Comments: Regular rate and rhythm.  S1/S2 are distinct without any evidence of murmur, rubs, or gallops.  Radial pulses are 2+ bilaterally.  Dorsalis pedis pulses are 2+ bilaterally.  No evidence of pedal edema. Pulmonary:     Comments: Clear to auscultation bilaterally.  Normal effort.  No respiratory distress.  No evidence of wheezes, rales, or rhonchi heard throughout. Abdominal:     General: Abdomen is flat. Bowel sounds are normal. There is no distension.     Tenderness: There is abdominal tenderness in the right upper quadrant. There is no guarding or rebound.  Musculoskeletal:        General: Normal range of motion.     Cervical back: Neck supple.  Skin:    General: Skin is warm and dry.     Findings: No rash.  Neurological:  General: No focal deficit present.     Mental Status: He is alert.  Psychiatric:        Mood and Affect: Mood normal.        Behavior: Behavior normal.     ED Results / Procedures / Treatments   Labs (all labs ordered are listed, but only abnormal results are displayed) Labs Reviewed  CBC WITH DIFFERENTIAL/PLATELET - Abnormal; Notable for the following components:      Result Value   RBC 5.86 (*)    All other components within normal limits  URINALYSIS, ROUTINE W REFLEX MICROSCOPIC - Abnormal; Notable for the following components:   Hgb urine dipstick TRACE (*)    Ketones, ur 40 (*)    Bacteria, UA RARE (*)    All other components within normal limits  COMPREHENSIVE METABOLIC PANEL  LIPASE, BLOOD    EKG None  Radiology US Abdomen Limited RUQ (LIVER/GB) Result Date: 10/23/2023 CLINICAL  DATA:  [id number] RUQ abdominal pain [id number] EXAM: ULTRASOUND ABDOMEN LIMITED RIGHT UPPER QUADRANT COMPARISON:  CT AP, 09/30/2023.  CT CAP, 09/01/2022 FINDINGS: Gallbladder: Dependent echogenic focus of the gallbladder neck with posterior acoustic shadowing, measuring up to 0.8 cm. No gallbladder wall thickening visualized. No sonographic Murphy sign noted by sonographer. Common bile duct: Diameter: 0.5 cm Liver: No focal lesion identified. Mildly increased hepatic parenchymal echogenicity. Portal vein is patent on color Doppler imaging with normal direction of blood flow towards the liver. Other: No perihepatic fluid IMPRESSION: 1. Cholelithiasis. No sonographic findings to suggest acute cholecystitis. 2. Echogenic liver. Findings most commonly seen in hepatic steatosis, though may also represent hepatitis and/or fibrosis. Electronically Signed   By: Roanna Banning M.D.   On: 10/23/2023 15:46    Procedures Procedures    Medications Ordered in ED Medications  ketorolac (TORADOL) 15 MG/ML injection 15 mg (15 mg Intravenous Given 10/23/23 1729)    ED Course/ Medical Decision Making/ A&P Clinical Course as of 10/23/23 1735  Thu Oct 23, 2023  1731 Urinalysis, Routine w reflex microscopic -Urine, Clean Catch(!) Negative. [CF]  1734 CBC with Differential(!) Negative. [CF]  1734 Lipase, blood Negative. [CF]  1734 Comprehensive metabolic panel Negative. [CF]  1734 US Abdomen Limited RUQ (LIVER/GB) I personally ordered and interpreted the study.  I do not see any evidence of cholecystitis at this time.  I do agree with radiologist interpretation. [CF]    Clinical Course User Index [CF] Teressa Lower, PA-C   {   Click here for ABCD2, HEART and other calculatorsMedical Decision Making Ross Best is a 38 y.o. male patient who presents to the emergency department today for further evaluation of flank pain and right upper quadrant abdominal pain.  I am suspicious for gallbladder pathology.  Will  plan to get some basic labs to look for liver enzymes in addition to lipase.  Patient is in no acute distress at this time.  Vital signs are otherwise normal apart from some slightly high blood pressure.  All of his labs are reassuring.  Ultrasound does reveal some sludge.  I am favoring biliary colic here.  Patient's pain is controlled and overall mild.  I am going to give him follow-up with general surgery as needed if pain and symptoms become more frequent.  He was also encouraged to return to the emergency department for any worsening symptoms.  Amount and/or Complexity of Data Reviewed Labs: ordered. Decision-making details documented in ED Course. Radiology: ordered.  Risk Prescription drug management.    Final Clinical  Impression(s) / ED Diagnoses Final diagnoses:  Biliary colic    Rx / DC Orders ED Discharge Orders     None         Teressa Lower, [place] 10/23/23 1735    Terald Sleeper, MD 10/23/23 [phone]

## 2023-10-23 NOTE — ED Notes (Signed)
Pt states he is unable to provide urine sample at this time

## 2023-10-23 NOTE — ED Notes (Signed)
 Discharge paperwork given and verbally understood.

## 2023-10-23 NOTE — ED Notes (Signed)
 Pt aware of the need for a urine... Unable to currently provide the sample.Marland KitchenMarland Kitchen

## 2023-10-23 NOTE — ED Triage Notes (Signed)
 C/o R sided flank and Abd pain. Seen here recently for diverticulitis. Denies n/v/d.

## 2023-10-29 ENCOUNTER — Other Ambulatory Visit: Payer: Self-pay | Admitting: Surgery

## 2023-10-29 DIAGNOSIS — R1011 Right upper quadrant pain: Secondary | ICD-10-CM

## 2023-11-11 ENCOUNTER — Encounter
Admission: RE | Admit: 2023-11-11 | Discharge: 2023-11-11 | Disposition: A | Discharge status: Home / Self Care | Payer: Self-pay | Source: Ambulatory Visit | Attending: Surgery | Admitting: Surgery

## 2023-11-11 DIAGNOSIS — R1011 Right upper quadrant pain: Secondary | ICD-10-CM | POA: Insufficient documentation

## 2023-11-12 ENCOUNTER — Ambulatory Visit
Admission: RE | Admit: 2023-11-12 | Discharge: 2023-11-12 | Disposition: A | Discharge status: Home / Self Care | Payer: Self-pay | Source: Ambulatory Visit | Attending: Surgery | Admitting: Surgery

## 2023-11-12 DIAGNOSIS — R1011 Right upper quadrant pain: Secondary | ICD-10-CM | POA: Insufficient documentation

## 2023-11-12 DIAGNOSIS — K8020 Calculus of gallbladder without cholecystitis without obstruction: Secondary | ICD-10-CM | POA: Insufficient documentation

## 2023-11-12 MED ORDER — TECHNETIUM TC 99M MEBROFENIN IV KIT
5.2700 | PACK | Freq: Once | INTRAVENOUS | Status: AC | PRN
  Administered 2023-11-12: 5.27 via INTRAVENOUS

## 2023-11-13 ENCOUNTER — Encounter (HOSPITAL_COMMUNITY): Payer: Self-pay

## 2023-11-13 ENCOUNTER — Emergency Department (HOSPITAL_COMMUNITY)
Admission: EM | Admit: 2023-11-13 | Discharge: 2023-11-13 | Disposition: A | Discharge status: Home / Self Care | Payer: Self-pay | Attending: Emergency Medicine | Admitting: Emergency Medicine

## 2023-11-13 ENCOUNTER — Other Ambulatory Visit: Payer: Self-pay

## 2023-11-13 ENCOUNTER — Emergency Department (HOSPITAL_COMMUNITY): Payer: Self-pay

## 2023-11-13 DIAGNOSIS — N132 Hydronephrosis with renal and ureteral calculous obstruction: Secondary | ICD-10-CM | POA: Insufficient documentation

## 2023-11-13 DIAGNOSIS — Z79899 Other long term (current) drug therapy: Secondary | ICD-10-CM | POA: Insufficient documentation

## 2023-11-13 DIAGNOSIS — N200 Calculus of kidney: Secondary | ICD-10-CM

## 2023-11-13 DIAGNOSIS — I10 Essential (primary) hypertension: Secondary | ICD-10-CM | POA: Insufficient documentation

## 2023-11-13 LAB — BASIC METABOLIC PANEL WITH GFR
Anion gap: 7 (ref 5–15)
BUN: 12 mg/dL (ref 6–20)
CO2: 27 mmol/L (ref 22–32)
Calcium: 8.5 mg/dL — ABNORMAL LOW (ref 8.9–10.3)
Chloride: 105 mmol/L (ref 98–111)
Creatinine, Ser: 0.87 mg/dL (ref 0.61–1.24)
GFR, Estimated: >60 mL/min (ref >60)
Glucose, Bld: 107 mg/dL — ABNORMAL HIGH (ref 70–99)
Potassium: 3.5 mmol/L (ref 3.5–5.1)
Sodium: 139 mmol/L (ref 135–145)

## 2023-11-13 LAB — URINALYSIS, ROUTINE W REFLEX MICROSCOPIC
Bacteria, UA: NONE SEEN
Bilirubin Urine: NEGATIVE
Glucose, UA: NEGATIVE mg/dL
Ketones, ur: NEGATIVE mg/dL
Leukocytes,Ua: NEGATIVE
Nitrite: NEGATIVE
Protein, ur: 30 mg/dL — AB
RBC / HPF: >50 RBC/hpf (ref 0–5)
Specific Gravity, Urine: 1.013 (ref 1.005–1.030)
pH: 6 (ref 5.0–8.0)

## 2023-11-13 LAB — CBC WITH DIFFERENTIAL/PLATELET
Abs Immature Granulocytes: 0.04 10*3/uL (ref 0.00–0.07)
Basophils Absolute: 0.1 10*3/uL (ref 0.0–0.1)
Basophils Relative: 1 %
Eosinophils Absolute: 0.2 10*3/uL (ref 0.0–0.5)
Eosinophils Relative: 2 %
HCT: 45.1 % (ref 39.0–52.0)
Hemoglobin: 14.6 g/dL (ref 13.0–17.0)
Immature Granulocytes: 0 %
Lymphocytes Relative: 20 %
Lymphs Abs: 1.9 10*3/uL (ref 0.7–4.0)
MCH: 27.3 pg (ref 26.0–34.0)
MCHC: 32.4 g/dL (ref 30.0–36.0)
MCV: 84.3 fL (ref 80.0–100.0)
Monocytes Absolute: 0.9 10*3/uL (ref 0.1–1.0)
Monocytes Relative: 9 %
Neutro Abs: 6.7 10*3/uL (ref 1.7–7.7)
Neutrophils Relative %: 68 %
Platelets: 236 10*3/uL (ref 150–400)
RBC: 5.35 MIL/uL (ref 4.22–5.81)
RDW: 12.8 % (ref 11.5–15.5)
WBC: 9.8 10*3/uL (ref 4.0–10.5)
nRBC: 0 % (ref 0.0–0.2)

## 2023-11-13 MED ORDER — ONDANSETRON 8 MG PO TBDP: ORAL_TABLET | ORAL | 0 refills | Status: AC

## 2023-11-13 MED ORDER — DICLOFENAC SODIUM ER 100 MG PO TB24: 100.0000 mg | ORAL_TABLET | Freq: Every day | ORAL | 0 refills | Status: AC

## 2023-11-13 MED ORDER — TAMSULOSIN HCL 0.4 MG PO CAPS
0.4000 mg | ORAL_CAPSULE | ORAL | Status: AC
  Administered 2023-11-13: 0.4 mg via ORAL
  Filled 2023-11-13: qty 1

## 2023-11-13 MED ORDER — ONDANSETRON HCL 4 MG/2ML IJ SOLN
4.0000 mg | Freq: Once | INTRAMUSCULAR | Status: AC
  Administered 2023-11-13: 4 mg via INTRAVENOUS
  Filled 2023-11-13: qty 2

## 2023-11-13 MED ORDER — MAGNESIUM SULFATE 2 GM/50ML IV SOLN
2.0000 g | Freq: Once | INTRAVENOUS | Status: AC
  Administered 2023-11-13: 2 g via INTRAVENOUS
  Filled 2023-11-13: qty 50

## 2023-11-13 MED ORDER — TRAMADOL HCL 50 MG PO TABS: 50.0000 mg | ORAL_TABLET | Freq: Four times a day (QID) | ORAL | 0 refills | Status: AC | PRN

## 2023-11-13 MED ORDER — TAMSULOSIN HCL 0.4 MG PO CAPS: 0.4000 mg | ORAL_CAPSULE | Freq: Every day | ORAL | 0 refills | Status: AC

## 2023-11-13 MED ORDER — KETOROLAC TROMETHAMINE 30 MG/ML IJ SOLN
30.0000 mg | Freq: Once | INTRAMUSCULAR | Status: AC
  Administered 2023-11-13: 30 mg via INTRAVENOUS
  Filled 2023-11-13: qty 1

## 2023-11-13 NOTE — ED Triage Notes (Signed)
 Pt BIB [facility] with cc of back and flank pain 8/10 that started at 1800. Pt ambulated to EMS truck and into triage. Hx of kidney and gallstones. Denies hematuria. Dysuria, fever, or chills.  72-130/80-99%RA

## 2023-11-13 NOTE — ED Provider Notes (Signed)
 [place] EMERGENCY DEPARTMENT AT [facility] Provider Note   CSN: [id number] Arrival date & time: 11/13/23  0018     History  Chief Complaint  Patient presents with   Back Pain   Flank Pain    Ross Best is a 38 y.o. male.  The history is provided by the patient.  Flank Pain This is a new problem. The current episode started 3 to 5 hours ago. The problem occurs constantly. The problem has not changed since onset.Pertinent negatives include no shortness of breath. Nothing aggravates the symptoms. Nothing relieves the symptoms. He has tried nothing for the symptoms. The treatment provided no relief.      Past Medical History:  Diagnosis Date   Hypertension    Kidney stone        Home Medications Prior to Admission medications   Medication Sig Start Date End Date Taking? Authorizing Provider  Diclofenac Sodium CR 100 MG 24 hr tablet Take 1 tablet (100 mg total) by mouth daily. 11/13/23  Yes Nickson Middlesworth, MD  ondansetron (ZOFRAN-ODT) 8 MG disintegrating tablet 8mg  ODT q8 hours prn nausea 11/13/23  Yes Tayja Manzer, MD  tamsulosin (FLOMAX) 0.4 MG CAPS capsule Take 1 capsule (0.4 mg total) by mouth daily. 11/13/23  Yes Silveria Botz, MD  traMADol (ULTRAM) 50 MG tablet Take 1 tablet (50 mg total) by mouth every 6 (six) hours as needed. 11/13/23  Yes Lorrie Strauch, MD  amoxicillin-clavulanate (AUGMENTIN) 875-125 MG tablet Take 1 tablet by mouth every 12 (twelve) hours. 09/30/23   Sherian Maroon A, PA  escitalopram (LEXAPRO) 5 MG tablet Take 5 mg by mouth daily.    [provider]  famotidine (PEPCID) 20 MG tablet Take 1 tablet (20 mg total) by mouth 2 (two) times daily. 09/01/22   Mesner, Barbara Cower, MD  losartan (COZAAR) 100 MG tablet Take 100 mg by mouth daily.    [provider]  ondansetron (ZOFRAN-ODT) 4 MG disintegrating tablet Take 1 tablet (4 mg total) by mouth every 8 (eight) hours as needed. 09/30/23   Peter Garter, PA  pantoprazole  (PROTONIX) 20 MG tablet Take 1 tablet (20 mg total) by mouth daily. 09/01/22   Mesner, Barbara Cower, MD  sucralfate (CARAFATE) 1 GM/10ML suspension Take 10 mLs (1 g total) by mouth 4 (four) times daily -  with meals and at bedtime. 11/04/21   Gilda Crease, MD      Allergies    Patient has no known allergies.    Review of Systems   Review of Systems  Respiratory:  Negative for shortness of breath.   Cardiovascular:  Negative for chest pain.  Gastrointestinal:  Negative for abdominal pain.  Genitourinary:  Negative for flank pain.  Neurological:  Negative for headaches.  All other systems reviewed and are negative.   Physical Exam Updated Vital Signs BP (!) 153/85   Pulse (!) 55   Temp 98.7 F (37.1 C)   Resp 18   Ht 6\' 1"  (1.854 m)   Wt 113.4 kg   SpO2 96%   BMI 32.98 kg/m  Physical Exam  ED Results / Procedures / Treatments   Labs (all labs ordered are listed, but only abnormal results are displayed) Results for orders placed or performed during the hospital encounter of 11/13/23  CBC with Differential   Collection Time: 11/13/23  4:25 AM  Result Value Ref Range   WBC 9.8 4.0 - 10.5 K/uL   RBC 5.35 4.22 - 5.81 MIL/uL   Hemoglobin  14.6 13.0 - 17.0 g/dL   HCT 16.1 09.6 - 04.5 %   MCV 84.3 80.0 - 100.0 fL   MCH 27.3 26.0 - 34.0 pg   MCHC 32.4 30.0 - 36.0 g/dL   RDW 40.9 81.1 - 91.4 %   Platelets 236 150 - 400 K/uL   nRBC 0.0 0.0 - 0.2 %   Neutrophils Relative % 68 %   Neutro Abs 6.7 1.7 - 7.7 K/uL   Lymphocytes Relative 20 %   Lymphs Abs 1.9 0.7 - 4.0 K/uL   Monocytes Relative 9 %   Monocytes Absolute 0.9 0.1 - 1.0 K/uL   Eosinophils Relative 2 %   Eosinophils Absolute 0.2 0.0 - 0.5 K/uL   Basophils Relative 1 %   Basophils Absolute 0.1 0.0 - 0.1 K/uL   Immature Granulocytes 0 %   Abs Immature Granulocytes 0.04 0.00 - 0.07 K/uL  Basic metabolic panel   Collection Time: 11/13/23  4:25 AM  Result Value Ref Range   Sodium 139 135 - 145 mmol/L   Potassium 3.5  3.5 - 5.1 mmol/L   Chloride 105 98 - 111 mmol/L   CO2 27 22 - 32 mmol/L   Glucose, Bld 107 (H) 70 - 99 mg/dL   BUN 12 6 - 20 mg/dL   Creatinine, Ser 7.82 0.61 - 1.24 mg/dL   Calcium 8.5 (L) 8.9 - 10.3 mg/dL   GFR, Estimated >95 >62 mL/min   Anion gap 7 5 - 15  Urinalysis, Routine w reflex microscopic -Urine, Clean Catch   Collection Time: 11/13/23  4:27 AM  Result Value Ref Range   Color, Urine YELLOW YELLOW   APPearance HAZY (A) CLEAR   Specific Gravity, Urine 1.013 1.005 - 1.030   pH 6.0 5.0 - 8.0   Glucose, UA NEGATIVE NEGATIVE mg/dL   Hgb urine dipstick LARGE (A) NEGATIVE   Bilirubin Urine NEGATIVE NEGATIVE   Ketones, ur NEGATIVE NEGATIVE mg/dL   Protein, ur 30 (A) NEGATIVE mg/dL   Nitrite NEGATIVE NEGATIVE   Leukocytes,Ua NEGATIVE NEGATIVE   RBC / HPF >50 0 - 5 RBC/hpf   WBC, UA 6-10 0 - 5 WBC/hpf   Bacteria, UA NONE SEEN NONE SEEN   Squamous Epithelial / HPF 0-5 0 - 5 /HPF   Mucus PRESENT    CT Renal Stone Study Result Date: 11/13/2023 CLINICAL DATA:  Flank pain.  Evaluate for kidney stone. EXAM: CT ABDOMEN AND PELVIS WITHOUT CONTRAST TECHNIQUE: Multidetector CT imaging of the abdomen and pelvis was performed following the standard protocol without IV contrast. RADIATION DOSE REDUCTION: This exam was performed according to the departmental dose-optimization program which includes automated exposure control, adjustment of the mA and/or kV according to patient size and/or use of iterative reconstruction technique. COMPARISON:  09/30/2023 FINDINGS: Lower chest: No acute abnormality. Hepatobiliary: No focal liver abnormality. 5 mm calcified gallstone. No gallbladder wall thickening or inflammation. No bile duct dilatation. Pancreas: Unremarkable. No pancreatic ductal dilatation or surrounding inflammatory changes. Spleen: Normal in size without focal abnormality. Adrenals/Urinary Tract: Normal adrenal glands. Punctate stone noted within the inferior pole of the left kidney. There is  mild right hydronephrosis and right hydroureter. At the level scratch set just below the bifurcation of the right common iliac artery there is a stone within the right ureter measuring 5 mm, image 113/5. No additional ureteral calculi. No stones within the bladder. Stomach/Bowel: Stomach is within normal limits. Appendix appears normal. No evidence of bowel wall thickening, distention, or inflammatory changes. Vascular/Lymphatic: Aortic atherosclerosis.  No abdominopelvic adenopathy. Reproductive: Prostate is unremarkable. Other: No abdominal wall hernia or abnormality. No abdominopelvic ascites. Musculoskeletal: No acute or significant osseous findings. IMPRESSION: 1. There is a 5 mm stone within the right ureter just below the bifurcation of the right common iliac artery. There is mild right hydronephrosis and right hydroureter. 2. Nonobstructing, punctate stone noted within the inferior pole of the left kidney. 3. Cholelithiasis. Electronically Signed   By: Signa Kell M.D.   On: 11/13/2023 05:44   US Abdomen Limited RUQ (LIVER/GB) Result Date: 10/23/2023 CLINICAL DATA:  [id number] RUQ abdominal pain [id number] EXAM: ULTRASOUND ABDOMEN LIMITED RIGHT UPPER QUADRANT COMPARISON:  CT AP, 09/30/2023.  CT CAP, 09/01/2022 FINDINGS: Gallbladder: Dependent echogenic focus of the gallbladder neck with posterior acoustic shadowing, measuring up to 0.8 cm. No gallbladder wall thickening visualized. No sonographic Murphy sign noted by sonographer. Common bile duct: Diameter: 0.5 cm Liver: No focal lesion identified. Mildly increased hepatic parenchymal echogenicity. Portal vein is patent on color Doppler imaging with normal direction of blood flow towards the liver. Other: No perihepatic fluid IMPRESSION: 1. Cholelithiasis. No sonographic findings to suggest acute cholecystitis. 2. Echogenic liver. Findings most commonly seen in hepatic steatosis, though may also represent hepatitis and/or fibrosis. Electronically Signed   By:  Roanna Banning M.D.   On: 10/23/2023 15:46     Radiology CT Renal Stone Study Result Date: 11/13/2023 CLINICAL DATA:  Flank pain.  Evaluate for kidney stone. EXAM: CT ABDOMEN AND PELVIS WITHOUT CONTRAST TECHNIQUE: Multidetector CT imaging of the abdomen and pelvis was performed following the standard protocol without IV contrast. RADIATION DOSE REDUCTION: This exam was performed according to the departmental dose-optimization program which includes automated exposure control, adjustment of the mA and/or kV according to patient size and/or use of iterative reconstruction technique. COMPARISON:  09/30/2023 FINDINGS: Lower chest: No acute abnormality. Hepatobiliary: No focal liver abnormality. 5 mm calcified gallstone. No gallbladder wall thickening or inflammation. No bile duct dilatation. Pancreas: Unremarkable. No pancreatic ductal dilatation or surrounding inflammatory changes. Spleen: Normal in size without focal abnormality. Adrenals/Urinary Tract: Normal adrenal glands. Punctate stone noted within the inferior pole of the left kidney. There is mild right hydronephrosis and right hydroureter. At the level scratch set just below the bifurcation of the right common iliac artery there is a stone within the right ureter measuring 5 mm, image 113/5. No additional ureteral calculi. No stones within the bladder. Stomach/Bowel: Stomach is within normal limits. Appendix appears normal. No evidence of bowel wall thickening, distention, or inflammatory changes. Vascular/Lymphatic: Aortic atherosclerosis. No abdominopelvic adenopathy. Reproductive: Prostate is unremarkable. Other: No abdominal wall hernia or abnormality. No abdominopelvic ascites. Musculoskeletal: No acute or significant osseous findings. IMPRESSION: 1. There is a 5 mm stone within the right ureter just below the bifurcation of the right common iliac artery. There is mild right hydronephrosis and right hydroureter. 2. Nonobstructing, punctate stone noted  within the inferior pole of the left kidney. 3. Cholelithiasis. Electronically Signed   By: Signa Kell M.D.   On: 11/13/2023 05:44    Procedures Procedures    Medications Ordered in ED Medications  magnesium sulfate IVPB 2 g 50 mL (2 g Intravenous New Bag/Given 11/13/23 0547)  ketorolac (TORADOL) 30 MG/ML injection 30 mg (30 mg Intravenous Given 11/13/23 0542)  ondansetron (ZOFRAN) injection 4 mg (4 mg Intravenous Given 11/13/23 0541)  tamsulosin (FLOMAX) capsule 0.4 mg (0.4 mg Oral Given 11/13/23 0541)    ED Course/ Medical Decision Making/ A&P  Medical Decision Making Patient with right flank since 9 pm   Amount and/or Complexity of Data Reviewed External Data Reviewed: notes.    Details: Previous notes reviewed  Labs: ordered.    Details: Urine with hematuria normal sodium 139, normal potassium 3.5, normal creatinine 0.87, normal white count 9.8 normal hemoglobin  Radiology: ordered and independent interpretation performed.    Details: Stone by me on CT  Risk Prescription drug management. Risk Details: Well appearing.  Patient with kidney stone.  Will start on medication.  Provide a strainer.  Will have patient follow up with urology.  Stable for discharge.      Final Clinical Impression(s) / ED Diagnoses Final diagnoses:  Kidney stone   No signs of systemic illness or infection. The patient is nontoxic-appearing on exam and vital signs are within normal limits.  I have reviewed the triage vital signs and the nursing notes. Pertinent labs & imaging results that were available during my care of the patient were reviewed by me and considered in my medical decision making (see chart for details). After history, exam, and medical workup I feel the patient has been appropriately medically screened and is safe for discharge home. Pertinent diagnoses were discussed with the patient. Patient was given return precautio Rx / DC Orders ED Discharge  Orders          Ordered    Diclofenac Sodium CR 100 MG 24 hr tablet  Daily        11/13/23 0556    ondansetron (ZOFRAN-ODT) 8 MG disintegrating tablet        11/13/23 0556    tamsulosin (FLOMAX) 0.4 MG CAPS capsule  Daily        11/13/23 0556    traMADol (ULTRAM) 50 MG tablet  Every 6 hours PRN        11/13/23 0556              Davontae Prusinski, MD 11/13/23 0630

## 2023-12-22 ENCOUNTER — Ambulatory Visit (INDEPENDENT_AMBULATORY_CARE_PROVIDER_SITE_OTHER): Payer: Self-pay

## 2023-12-22 DIAGNOSIS — K200 Eosinophilic esophagitis: Secondary | ICD-10-CM

## 2023-12-22 DIAGNOSIS — R1310 Dysphagia, unspecified: Secondary | ICD-10-CM
